# Patient Record
Sex: Male | Born: 1940 | Race: White | Hispanic: No | Marital: Married | Smoking: Former smoker
Health system: Southern US, Community
[De-identification: ages and names within clinical notes are randomized; demographics above are authoritative.]

## PROBLEM LIST (undated history)

## (undated) DIAGNOSIS — J449 Chronic obstructive pulmonary disease, unspecified: Secondary | ICD-10-CM

## (undated) DIAGNOSIS — K900 Celiac disease: Secondary | ICD-10-CM

## (undated) HISTORY — DX: Celiac disease: K90.0

## (undated) HISTORY — DX: Chronic obstructive pulmonary disease, unspecified: J44.9

---

## 1999-05-13 ENCOUNTER — Encounter: Payer: Self-pay | Admitting: *Deleted

## 1999-05-13 ENCOUNTER — Encounter: Admission: RE | Admit: 1999-05-13 | Discharge: 1999-05-13 | Payer: Self-pay | Admitting: *Deleted

## 1999-11-20 ENCOUNTER — Ambulatory Visit (HOSPITAL_COMMUNITY): Admission: RE | Admit: 1999-11-20 | Discharge: 1999-11-20 | Payer: Self-pay | Admitting: Gastroenterology

## 2003-03-16 HISTORY — PX: TOTAL HIP ARTHROPLASTY: SHX124

## 2004-04-02 ENCOUNTER — Inpatient Hospital Stay (HOSPITAL_COMMUNITY): Admission: EM | Admit: 2004-04-02 | Discharge: 2004-04-06 | Payer: Self-pay | Admitting: Emergency Medicine

## 2004-04-02 ENCOUNTER — Encounter: Admission: RE | Admit: 2004-04-02 | Discharge: 2004-04-02 | Payer: Self-pay | Admitting: Occupational Medicine

## 2005-11-16 ENCOUNTER — Ambulatory Visit: Payer: Self-pay | Admitting: Emergency Medicine

## 2005-12-29 ENCOUNTER — Ambulatory Visit: Payer: Self-pay | Admitting: Emergency Medicine

## 2006-02-09 ENCOUNTER — Ambulatory Visit: Payer: Self-pay | Admitting: Emergency Medicine

## 2007-01-10 DIAGNOSIS — F17200 Nicotine dependence, unspecified, uncomplicated: Secondary | ICD-10-CM | POA: Insufficient documentation

## 2007-01-10 DIAGNOSIS — J438 Other emphysema: Secondary | ICD-10-CM

## 2007-01-10 DIAGNOSIS — J45909 Unspecified asthma, uncomplicated: Secondary | ICD-10-CM | POA: Insufficient documentation

## 2007-01-10 DIAGNOSIS — Z96649 Presence of unspecified artificial hip joint: Secondary | ICD-10-CM | POA: Insufficient documentation

## 2007-01-10 DIAGNOSIS — J449 Chronic obstructive pulmonary disease, unspecified: Secondary | ICD-10-CM

## 2007-01-10 DIAGNOSIS — K900 Celiac disease: Secondary | ICD-10-CM

## 2010-06-18 ENCOUNTER — Inpatient Hospital Stay (HOSPITAL_COMMUNITY)
Admission: EM | Admit: 2010-06-18 | Discharge: 2010-06-22 | DRG: 191 | Disposition: A | Discharge status: Home Health | Payer: Medicare Other | Attending: Internal Medicine | Admitting: Internal Medicine

## 2010-06-18 ENCOUNTER — Emergency Department (HOSPITAL_COMMUNITY): Payer: Medicare Other

## 2010-06-18 DIAGNOSIS — M8448XA Pathological fracture, other site, initial encounter for fracture: Secondary | ICD-10-CM | POA: Diagnosis present

## 2010-06-18 DIAGNOSIS — F17200 Nicotine dependence, unspecified, uncomplicated: Secondary | ICD-10-CM | POA: Diagnosis present

## 2010-06-18 DIAGNOSIS — J441 Chronic obstructive pulmonary disease with (acute) exacerbation: Principal | ICD-10-CM | POA: Diagnosis present

## 2010-06-18 DIAGNOSIS — R000 Tachycardia, unspecified: Secondary | ICD-10-CM | POA: Diagnosis present

## 2010-06-18 DIAGNOSIS — R0609 Other forms of dyspnea: Secondary | ICD-10-CM | POA: Diagnosis present

## 2010-06-18 DIAGNOSIS — R0989 Other specified symptoms and signs involving the circulatory and respiratory systems: Secondary | ICD-10-CM | POA: Diagnosis present

## 2010-06-18 DIAGNOSIS — R64 Cachexia: Secondary | ICD-10-CM | POA: Diagnosis present

## 2010-06-18 DIAGNOSIS — E873 Alkalosis: Secondary | ICD-10-CM | POA: Diagnosis present

## 2010-06-18 DIAGNOSIS — E871 Hypo-osmolality and hyponatremia: Secondary | ICD-10-CM | POA: Diagnosis present

## 2010-06-18 DIAGNOSIS — Z23 Encounter for immunization: Secondary | ICD-10-CM

## 2010-06-18 DIAGNOSIS — G8929 Other chronic pain: Secondary | ICD-10-CM | POA: Diagnosis present

## 2010-06-18 DIAGNOSIS — I2789 Other specified pulmonary heart diseases: Secondary | ICD-10-CM | POA: Diagnosis present

## 2010-06-18 DIAGNOSIS — I10 Essential (primary) hypertension: Secondary | ICD-10-CM | POA: Diagnosis present

## 2010-06-18 DIAGNOSIS — Z9981 Dependence on supplemental oxygen: Secondary | ICD-10-CM

## 2010-06-18 LAB — DIFFERENTIAL
Basophils Absolute: 0 10*3/uL (ref 0.0–0.1)
Basophils Relative: 0 % (ref 0–1)
Eosinophils Absolute: 0 10*3/uL (ref 0.0–0.7)
Eosinophils Relative: 1 % (ref 0–5)
Lymphocytes Relative: 11 % — ABNORMAL LOW (ref 12–46)
Lymphs Abs: 0.7 10*3/uL (ref 0.7–4.0)
Monocytes Absolute: 0.6 10*3/uL (ref 0.1–1.0)
Monocytes Relative: 10 % (ref 3–12)
Neutro Abs: 4.6 10*3/uL (ref 1.7–7.7)
Neutrophils Relative %: 78 % — ABNORMAL HIGH (ref 43–77)

## 2010-06-18 LAB — BASIC METABOLIC PANEL
BUN: 16 mg/dL (ref 6–23)
CO2: 32 mEq/L (ref 19–32)
Calcium: 9.2 mg/dL (ref 8.4–10.5)
Chloride: 82 mEq/L — ABNORMAL LOW (ref 96–112)
Creatinine, Ser: 0.71 mg/dL (ref 0.4–1.5)
GFR calc Af Amer: >60 mL/min (ref >60)
GFR calc non Af Amer: >60 mL/min (ref >60)
Glucose, Bld: 107 mg/dL — ABNORMAL HIGH (ref 70–99)
Potassium: 4.4 mEq/L (ref 3.5–5.1)
Sodium: 121 mEq/L — ABNORMAL LOW (ref 135–145)

## 2010-06-18 LAB — CBC
HCT: 36.6 % — ABNORMAL LOW (ref 39.0–52.0)
Hemoglobin: 12.9 g/dL — ABNORMAL LOW (ref 13.0–17.0)
MCH: 32.2 pg (ref 26.0–34.0)
MCHC: 35.2 g/dL (ref 30.0–36.0)
MCV: 91.3 fL (ref 78.0–100.0)
Platelets: 212 10*3/uL (ref 150–400)
RBC: 4.01 MIL/uL — ABNORMAL LOW (ref 4.22–5.81)
RDW: 13.6 % (ref 11.5–15.5)
WBC: 5.9 10*3/uL (ref 4.0–10.5)

## 2010-06-19 DIAGNOSIS — R0609 Other forms of dyspnea: Secondary | ICD-10-CM

## 2010-06-19 DIAGNOSIS — M7989 Other specified soft tissue disorders: Secondary | ICD-10-CM

## 2010-06-19 DIAGNOSIS — R0989 Other specified symptoms and signs involving the circulatory and respiratory systems: Secondary | ICD-10-CM

## 2010-06-19 LAB — COMPREHENSIVE METABOLIC PANEL
AST: 67 U/L — ABNORMAL HIGH (ref 0–37)
Albumin: 3.6 g/dL (ref 3.5–5.2)
BUN: 16 mg/dL (ref 6–23)
CO2: 33 mEq/L — ABNORMAL HIGH (ref 19–32)
Calcium: 9 mg/dL (ref 8.4–10.5)
Chloride: 87 mEq/L — ABNORMAL LOW (ref 96–112)
Creatinine, Ser: 0.68 mg/dL (ref 0.4–1.5)
GFR calc Af Amer: >60 mL/min (ref >60)
GFR calc non Af Amer: >60 mL/min (ref >60)
Total Bilirubin: 0.7 mg/dL (ref 0.3–1.2)

## 2010-06-19 LAB — CK TOTAL AND CKMB (NOT AT ARMC)
CK, MB: 20.8 ng/mL (ref 0.3–4.0)
Relative Index: 6 — ABNORMAL HIGH (ref 0.0–2.5)

## 2010-06-19 LAB — BASIC METABOLIC PANEL
CO2: 29 mEq/L (ref 19–32)
Chloride: 86 mEq/L — ABNORMAL LOW (ref 96–112)
GFR calc Af Amer: >60 mL/min (ref >60)
Glucose, Bld: 98 mg/dL (ref 70–99)
Sodium: 124 mEq/L — ABNORMAL LOW (ref 135–145)

## 2010-06-19 LAB — TROPONIN I: Troponin I: 0.01 ng/mL (ref 0.00–0.06)

## 2010-06-19 LAB — CARDIAC PANEL(CRET KIN+CKTOT+MB+TROPI)
CK, MB: 23.7 ng/mL (ref 0.3–4.0)
CK, MB: 25.6 ng/mL (ref 0.3–4.0)
Relative Index: 6.2 — ABNORMAL HIGH (ref 0.0–2.5)
Relative Index: 6.8 — ABNORMAL HIGH (ref 0.0–2.5)
Total CK: 385 U/L — ABNORMAL HIGH (ref 7–232)
Total CK: 377 U/L — ABNORMAL HIGH (ref 7–232)

## 2010-06-19 LAB — BRAIN NATRIURETIC PEPTIDE: Pro B Natriuretic peptide (BNP): 50 pg/mL (ref 0.0–100.0)

## 2010-06-19 LAB — TSH: TSH: 0.395 u[IU]/mL (ref 0.350–4.500)

## 2010-06-19 LAB — CREATININE, URINE, RANDOM: Creatinine, Urine: 94.3 mg/dL

## 2010-06-19 LAB — PROTEIN, URINE, RANDOM: Total Protein, Urine: 34 mg/dL

## 2010-06-20 DIAGNOSIS — J441 Chronic obstructive pulmonary disease with (acute) exacerbation: Secondary | ICD-10-CM

## 2010-06-20 LAB — CBC
MCH: 32.3 pg (ref 26.0–34.0)
MCHC: 34.8 g/dL (ref 30.0–36.0)
Platelets: 220 10*3/uL (ref 150–400)
RDW: 13.7 % (ref 11.5–15.5)

## 2010-06-20 LAB — BLOOD GAS, ARTERIAL
Acid-Base Excess: 8.7 mmol/L — ABNORMAL HIGH (ref 0.0–2.0)
Bicarbonate: 34.3 mEq/L — ABNORMAL HIGH (ref 20.0–24.0)
Patient temperature: 98.6
TCO2: 36.2 mmol/L (ref 0–100)
pH, Arterial: 7.352 (ref 7.350–7.450)

## 2010-06-20 LAB — RENAL FUNCTION PANEL
Albumin: 3.7 g/dL (ref 3.5–5.2)
BUN: 16 mg/dL (ref 6–23)
Calcium: 9.2 mg/dL (ref 8.4–10.5)
Chloride: 86 mEq/L — ABNORMAL LOW (ref 96–112)
Creatinine, Ser: 0.64 mg/dL (ref 0.4–1.5)

## 2010-06-20 LAB — MAGNESIUM: Magnesium: 1.9 mg/dL (ref 1.5–2.5)

## 2010-06-21 LAB — CBC
HCT: 35.1 % — ABNORMAL LOW (ref 39.0–52.0)
Hemoglobin: 11.9 g/dL — ABNORMAL LOW (ref 13.0–17.0)
MCHC: 33.9 g/dL (ref 30.0–36.0)
WBC: 5.7 10*3/uL (ref 4.0–10.5)

## 2010-06-21 LAB — BASIC METABOLIC PANEL
CO2: 39 mEq/L — ABNORMAL HIGH (ref 19–32)
Glucose, Bld: 99 mg/dL (ref 70–99)
Potassium: 3.9 mEq/L (ref 3.5–5.1)
Sodium: 127 mEq/L — ABNORMAL LOW (ref 135–145)

## 2010-06-22 DIAGNOSIS — J441 Chronic obstructive pulmonary disease with (acute) exacerbation: Secondary | ICD-10-CM

## 2010-06-22 LAB — RENAL FUNCTION PANEL
BUN: 20 mg/dL (ref 6–23)
Chloride: 84 mEq/L — ABNORMAL LOW (ref 96–112)
Glucose, Bld: 165 mg/dL — ABNORMAL HIGH (ref 70–99)
Potassium: 3.7 mEq/L (ref 3.5–5.1)

## 2010-06-22 LAB — OSMOLALITY: Osmolality: 274 mOsm/kg — ABNORMAL LOW (ref 275–300)

## 2010-06-22 LAB — KAPPA/LAMBDA LIGHT CHAINS
Kappa free light chain: 0.88 mg/dL (ref 0.33–1.94)
Kappa, lambda light chain ratio: 1.05 (ref 0.26–1.65)

## 2010-06-22 NOTE — Consult Note (Signed)
NAME:  Timothy Gonzalez, Timothy Gonzalez NO.:  [id number]  MEDICAL RECORD NO.:  [id number]           PATIENT TYPE:  I  LOCATION:  6711                         FACILITY:  [facility]  PHYSICIAN:  Zetta Bills, MD          DATE OF BIRTH:  1940-05-06  DATE OF CONSULTATION:  06/19/2010 DATE OF DISCHARGE:                                CONSULTATION   Nephrology was consulted by Dr. Betti Cruz of the [facility] for the evaluation and management of Timothy Gonzalez' hyponatremia.  REFERRING PHYSICIAN:  Dr. Betti Cruz of the [facility].  HISTORY OF PRESENT ILLNESS:  This is a 70 year old Caucasian man with a past medical history significant for several years of tobacco use with consequent COPD who quit about 2 weeks ago.  He is also on chronic oxygen therapy on account of his chronic obstructive lung disease.  He was noted to have lower extremity edema sometime last week and started on furosemide therapy and a subsequent lab check revealed a sodium of 120 that prompted his primary care provider, Dr. Lenise Arena, to send him to the hospital for further evaluation and management.  Timothy Gonzalez reports that he has had pedal edema now for about 6 weeks or so when he started sleeping in a recliner with his feet lower than the rest of his body in order to facilitate better rest.  He denies any recurrent problems with nausea, vomiting, or diarrhea.  States that he had frequent diarrhea a while ago prior to being diagnosed with celiac sprue and being placed on a gluten-free diet.  Denies any cough, however, has occasional shortness of breath and occasional a.m. sputum production.  Review of available records as far as 2007, indicates he likely has chronic hyponatremia with his best sodium at around 135 during hospitalization in 2007.  Outpatient records from the interim period are unavailable at the time of his consultation.  Since his admission to the hospital, he has undergone some  evaluation that reveals a normal TSH, elevated urine osmolality at 514, appropriately depressed serum osmolality at 257, normal 2-D echocardiogram with normal BNP, elevated urine sodium at 69 mEq/L (on furosemide) and sodium levels that have ranged from 120-127.  On admission, his furosemide therapy was stopped and he was temporarily started on normal saline of which he got about a liter that improved his sodium somewhat from 121-127; however, discontinuation of the same led to the sodium dropping to 124.  ACE inhibitor therapy was continued.  Chest x-ray done yesterday was negative for any space-occupying lesions.  He remains hypoxic with an oxygen saturation of 92-95% on oxygen by nasal cannula.  Worsening shortness of breath this morning is what prompted the discontinuation of his normal saline.  He has been getting around-the-clock nebulized bronchodilators and he is also being managed with corticosteroids as part of COPD exacerbation.  He denies significant NSAID use, admits to drinking "a large amount of coffee per day," approximately 8 ounces every 45 minutes from sunrise until sunset.  PAST MEDICAL HISTORY: 1. Chronic obstructive lung disease, oxygen dependent. 2. Celiac disease. 3. History of chronic back pain secondary to fracture.  4. Status post right hip fracture. 5. What appears to be chronic hyponatremia.  MEDICATIONS: 1. Spiriva HandiHaler 18 mcg 1 capsule daily. 2. Benazepril 20 mg p.o. daily. 3. Vicodin 5/500 one p.o. q.6 h. p.r.n. 4. ProAir inhaler 2 puffs b.i.d. p.r.n. 5. Over-the-counter Primatene Mist.  ALLERGIES:  No known drug allergies.  SOCIAL HISTORY:  Married and resides at home with his wife.  He denies any alcohol use, quit smoking about 2 weeks ago.  States that appetite is improved since then.  Denies any illicit drug abuse.  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  FAMILY HISTORY:  Noncontributory.  REVIEW  OF SYSTEMS:  Attempted to obtain; however, the patient had minimal verbal responses while he was on the nebulizer treatment.  PHYSICAL EXAMINATION:  VITAL SIGNS:  Blood pressure 144/76, respiratory rate ranging from 18-24, heart rate ranging 86-97, temperature of 98.2 degrees Fahrenheit. GENERAL:  Thin, tall Caucasian man who appears malnourished. HEAD, NECK, AND ENT:  Head is normocephalic and atraumatic.  Pupils are bilaterally equal and reactive to light.  Extraocular muscle movements are normal. NECK:  Supple with some difficulties with mobilization.  JVD is present bilaterally to about 7 cm. CARDIOVASCULAR:  Pulses regular in rate and rhythm.  Heart sounds S1 and S2 are normal without any murmurs, rubs, or gallops. RESPIRATORY:  Distant breath sounds bilaterally without any rales, retractions, or rhonchi. ABDOMEN:  Soft, flat, nontender without any organomegaly or masses. Bowel sounds are normal. EXTREMITIES:  2+ to 3+ edema is palpable bipedally extending all the way to the knees.  LABORATORY DATA:  Sodium 124, potassium 4.5, bicarbonate 29, BUN 17, creatinine 0.6, glucose 90, phosphorus 3.6, magnesium 1.7, albumin 3.6, total protein 6.1, AST 67, ALT 50, hemoglobin 12.9 g/dL, platelet count 308, white cell count 5900.  ASSESSMENT AND PLAN: 1. Hyponatremia.  Labs reflect that he likely has a chronic     hyponatremia, most likely tied into his chronic obstructive lung     disease.  His low plasma osmolality reflects that this is a true     hyponatremia, while his volume status indicates that he is volume     overloaded/hypervolemic.  His urine osmolality is     inappropriately elevated with an elevated urine sodium (albeit on     furosemide) suggesting an activated ADH state.  His thyroid-     stimulating hormone is within normal limits and cardiac assessment     reflects a preserved systolic function.  This constellation raises     concern for syndrome of inappropriate ADH  secretion especially in a     patient with a history of chronic tobacco use and COPD for     malignancy.  A COPD flare, however, can also provoke ADH activation     with ACE inhibitor therapy for further limiting ADH suppression.  I     agree with holding his ACE inhibitor therapy, restricting all oral     fluids to 1238ml/day and avoiding isotonic saline given his high urine sodium and     volume overload.  I would restart his furosemide and monitor labs     sequentially.  Do not feel based on his neurological status that we     need an aquaretic or hypertonic saline at this time.  He will     eventually need a CT scan of his chest, consideration of CT abdomen     and pelvis in order to rule out occult malignancy.  Given his mild     hypoalbuminemia, I will check a urine protein to creatinine ratio     as well as an SPEP and free light     chains to screen for plasma cell dyscrasia. 2. Hypertension.  Discontinue ACE inhibitor for now, restart     furosemide and avoid all thiazide diuretics.  Start him on a low     dose of calcium channel blocker to engage with blood pressure     control.     Zetta Bills, MD     JP/MEDQ  D:  06/19/2010  T:  06/20/2010  Job:  [id number]  cc:   Joycelyn Rua, M.D.  Electronically Signed by Zetta Bills MD on 06/22/2010 02:34:17 PM

## 2010-06-23 LAB — PROTEIN ELECTROPHORESIS, SERUM
Gamma Globulin: 14 % (ref 11.1–18.8)
M-Spike, %: NOT DETECTED g/dL
Total Protein ELP: 6.4 g/dL (ref 6.0–8.3)

## 2010-06-25 NOTE — Discharge Summary (Signed)
[name], [name] NO.:  [id number]  MEDICAL RECORD NO.:  [id number]           PATIENT TYPE:  LOCATION:                                 FACILITY:  PHYSICIAN:  Andreas Blower, MD       DATE OF BIRTH:  1941-01-12  DATE OF ADMISSION: DATE OF DISCHARGE:                              DISCHARGE SUMMARY   PRIMARY CARE PHYSICIAN:  Joycelyn Rua, MD  NEPHROLOGIST:  Dr. Allena Katz  PULMONOLOGIST:  Leslye Peer, MD  DISCHARGE DIAGNOSES: 1. Hyponatremia. 2. Chronic obstructive pulmonary disease exacerbation. 3. Hypertension. 4. Acute respiratory distress due to chronic obstructive pulmonary     disease exacerbation. 5. Tachycardia. 6. Lower extremity swelling. 7. 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 Elevated bicarb/metabolic alkalosis.  DISCHARGE MEDICATIONS: 1. Amlodipine 5 mg p.o. daily. 2. Symbicort 2 puffs inhaled twice daily. 3. Prednisone 10 mg p.o. daily for 3 days, then 5 mg p.o. daily for 3     days, then discontinue. 4. Albuterol nebulizer inhaled 4 times a day as needed. 5. Hydrocodone/APAP 5/500 every 6 hours as needed for pain. 6. Lasix 20 mg p.o. daily. 7. Albuterol 90 mcg 1 puff every 6 hours as needed for shortness of     breath. 8. Spiriva 18 mcg inhaled daily. 9. Vitamin B12 1000 mcg p.o. daily.  BRIEF ADMITTING HISTORY AND PHYSICAL:  [name] is a 70 year old gentleman with history of tobacco use who quit recently about 1-2 weeks ago and COPD requiring oxygen with chronic lower extremity swelling.  He was brought in to the ER after he was called by his primary care physician that he had a low sodium of 120.  RADIOLOGY/IMAGING:  The patient had portable chest x-ray which showed no interstitial changes, COPD, no pneumonia noted.  The patient had a 2-D echocardiogram on June 19, 2010, which showed left ventricle cavity size was normal.  Wall thickness was normal.  Systolic function was normal. Ejection fraction  was 55-60%.  Pulmonary artery systolic pressure was mildly increased to 42 mmHg.  LABORATORY DATA:  CBC shows a white count of 5.7, hemoglobin 11.9, hematocrit 35.1, and platelet count 209.  Electrolytes:  Sodium 131, initially on presentation was 121, potassium 3.7, chloride 87, CO2 of 24, BUN 20, creatinine 0.70.  Liver function tests normal except AST is 67.  Kappa lambda light chain and the ratio was normal.  Troponins negative x3.  TSH is 0.395.  CONSULTATIONS OBTAINED DURING COURSE OF HOSPITAL STAY: 1. Dr. Allena Katz with Renal. 2. Dr. Delton Coombes with Pulmonary.  DISPOSITION AND FOLLOWUP:  The patient is to follow with Dr. Joycelyn Rua, his primary care physician in 1 week.  Dr. Daphane Shepherd is to check laboratory workup for BMET in 2-3 days.  The patient is to follow with Dr. Delton Coombes, his new pulmonologist on Jul 23, 2010, at 12 p.m.  HOSPITAL COURSE BY PROBLEM: 1. Acute respiratory distress/COPD exacerbation.  Initially, the     patient was started on a prednisone taper.  During the course of     the hospital stay, the patient had a few episodes of shortness of     breath  especially in the morning.  As a result, pulmonary     consultation was obtained.  Dr. Delton Coombes evaluated the patient and     adjusted his COPD inhalers and medications.  During the course of     the hospital stay, the patient's breathing continued to improve.     After discharge, the patient is to follow with Dr. Delton Coombes on Jul 23, 2010.  Could consider getting outpatient pulmonary function tests     after acute issues are resolved. 2. Hyponatremia, most likely due to COPD exacerbation and ACE     inhibitor use.  During the course of the hospital stay, his     benazepril was discontinued.  The patient was also fluid restricted     and was started on Lasix.  During the course of the hospital stay,     the patient's sodium continued to improve to 131 at the time of     discharge.  The patient is to follow up with his primary  care     physician for a BMET to be checked in 2-3 days.  Follow up with     Renal as needed outpatient. 3. Hypertension.  Given that the patient's benazepril was     discontinued, the patient was started on amlodipine with good     control of his blood pressure. 4. Tachycardia, most likely secondary to albuterol, stable during the     course of the hospital stay. 5. Lower extremity swelling, likely secondary to shortness of breath,     COPD, and mild pulmonary hypertension, stable during the course of     the hospital stay. 6. Elevated bicarbonate/metabolic alkalosis.  During the course of the     hospital stay, the patient was fluid restricted and was on Lasix     twice daily.  I suspect that the elevated bicarbonate level may be     due to contracture alkalosis.  His Lasix dose was decreased to 20     mg p.o. daily at the time of discharge.  His elevated bicarbonate     may also be due to his COPD. 7. Mild pulmonary artery hypertension noted on 2-D echo, likely     secondary to COPD, stable at this time.  Time spent on discharge talking to the patient and coordinating care was 35 minutes.  At the time of discharge, we also arranged for hospital bed, bathroom chair, and wheelchair.   Andreas Blower, MD   SR/MEDQ  D:  06/22/2010  T:  06/23/2010  Job:  [id number]  cc:   Joycelyn Rua, M.D.  Electronically Signed by Wardell Heath Plato Alspaugh  on 06/25/2010 11:26:54 AM

## 2010-07-22 ENCOUNTER — Encounter: Payer: Self-pay | Admitting: Emergency Medicine

## 2010-07-23 ENCOUNTER — Encounter: Payer: Self-pay | Admitting: Emergency Medicine

## 2010-07-23 ENCOUNTER — Ambulatory Visit (INDEPENDENT_AMBULATORY_CARE_PROVIDER_SITE_OTHER): Payer: Medicare Other | Admitting: Emergency Medicine

## 2010-07-23 VITALS — BP 146/80 | HR 105 | Temp 96.8°F | Ht 75.0 in | Wt 139.8 lb

## 2010-07-23 DIAGNOSIS — J449 Chronic obstructive pulmonary disease, unspecified: Secondary | ICD-10-CM

## 2010-07-23 MED ORDER — BUDESONIDE-FORMOTEROL FUMARATE 160-4.5 MCG/ACT IN AERO: 2.0000 | INHALATION_SPRAY | Freq: Two times a day (BID) | RESPIRATORY_TRACT | Status: DC

## 2010-07-23 MED ORDER — TIOTROPIUM BROMIDE MONOHYDRATE 18 MCG IN CAPS: 18.0000 ug | ORAL_CAPSULE | Freq: Every day | RESPIRATORY_TRACT | Status: DC

## 2010-07-23 NOTE — Progress Notes (Signed)
  Subjective:    Patient ID: Timothy Gonzalez, male    DOB: 06/23/1940, 70 y.o.   MRN: [id number]  HPI  70 yo man, severe COPD with tobacco abuse, seen by me last in 2007. Was admitted with exacerbation in April 2012, seen by me then. Discharged on Spiriva + Symbicort, Prn albuterol. Stopped smoking after discharge! Since he was discharged has done well. He has hx panic attacks, well managed and helping the breathing as well.    Review of Systems  Respiratory:       Shortness of breath w/ activity  Musculoskeletal:       Hand / feet swelling  Psychiatric/Behavioral:       Anxiety   Past Medical History  Diagnosis Date  . COPD (chronic obstructive pulmonary disease)   . Celiac disease   . Asthma     childhood     No family history on file.   History   Social History  . Marital Status: Married    Spouse Name: N/A    Number of Children: 4  . Years of Education: N/A   Occupational History  . 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 Smoking status: Former Smoker -- 0.2 packs/day for 48 years    Types: Cigarettes    Quit date: 06/14/2010  . Smokeless tobacco: Not on file  . Alcohol Use: Yes     rare  . Drug Use: No  . Sexually Active: Not on file   Other Topics Concern  . Not on file   Social History Narrative  . No narrative on file     No Known Allergies   Outpatient Prescriptions Prior to Visit  Medication Sig Dispense Refill  . albuterol (PROAIR HFA) 108 (90 BASE) MCG/ACT inhaler Inhale 2 puffs into the lungs every 6 (six) hours as needed.        [name] albuterol (PROVENTIL) (2.5 MG/3ML) 0.083% nebulizer solution Take 2.5 mg by nebulization every 4 (four) hours as needed.        [name] amLODipine (NORVASC) 5 MG tablet Take 5 mg by mouth daily.        [name] HYDROcodone-acetaminophen (VICODIN) 5-500 MG per tablet Take 1 tablet by mouth every 6 (six) hours as needed.        . vitamin B-12 (CYANOCOBALAMIN) 1000 MCG tablet Take 1,000 mcg by mouth daily.         [name] tiotropium (SPIRIVA) 18 MCG inhalation capsule Place 18 mcg into inhaler and inhale daily.        . furosemide (LASIX) 20 MG tablet Take 20 mg by mouth daily.        . budesonide-formoterol (SYMBICORT) 80-4.5 MCG/ACT inhaler Inhale 2 puffs into the lungs 2 (two) times daily.               Objective:   Physical Exam Gen: Pleasant, cachectic, in no distress,  normal affect on RA  ENT: No lesions,  mouth clear,  oropharynx clear, no postnasal drip  Neck: No JVD, no TMG, no carotid bruits  Lungs: Very distant,   Cardiovascular: RRR, heart sounds normal, no murmur or gallops, B LE edema, 2+  Musculoskeletal: No deformities, no cyanosis or clubbing  Neuro: alert, non focal  Skin: Warm, no lesions or rashes       Assessment & Plan:  COPD Continue Symbicort + Spiriva O2 with exertion Congratulated and supported smoking cessation ROV 3 months

## 2010-07-23 NOTE — Assessment & Plan Note (Signed)
Continue Symbicort + Spiriva O2 with exertion Congratulated and supported smoking cessation ROV 3 months

## 2010-07-23 NOTE — Patient Instructions (Signed)
Continue your current inhaled medications, Spiriva and Symbicort Use albuterol as needed Wear your oxygen with exertion Follow up with Dr Delton Coombes in 3 months or sooner if you have any problems.

## 2010-07-26 NOTE — H&P (Signed)
NAME:  Timothy Gonzalez, Timothy Gonzalez           ACCOUNT NO.:  [id number]  MEDICAL RECORD NO.:  [id number]           PATIENT TYPE:  E  LOCATION:  [facility]                         FACILITY:  [facility]  PHYSICIAN:  Montie Gelardi I Sharnetta Gielow, MD      DATE OF BIRTH:  1940-06-23  DATE OF ADMISSION:  06/18/2010 DATE OF DISCHARGE:                             HISTORY & PHYSICAL   PRIMARY CARE PHYSICIAN:  Joycelyn Rua, MD  CHIEF COMPLAINT:  The patient sent from an MD office for evaluation of low sodium.  HISTORY OF PRESENT ILLNESS:  This is a 70 year old gentleman with a history of ongoing tobacco abuse just quit smoking 2 weeks ago, history of COPD, worsening, requiring home oxygen which was delivered to the patient 2 days ago.  Chronic lower extremity swelling, being recently treated by Lasix.  As per the patient, Lasis was recently started and the patient received dose yesterday and today.  The patient has a checkup with his MD, blood drawn, and he received a call from the MD suggesting a low sodium of 120 and was asked to come to the hospital for further evaluation.  The patient denies any shortness of breath that is not at the baseline.  He lives on a recliner.  His left lower extremity, keeps them elevated.  He denies any chest pain or shortness of breath.  Denies any cough.  Denies any nausea or vomiting or abdominal pain.  He has this chronic lower extremity swelling for more than 6 weeks now.  He denies any numbness or weakness.  He has chronic back pain from fracture.  PAST MEDICAL HISTORY: 1. COPD, on 2 L home oxygen. 2. Celiac sprue. 3. Chronic fracture of his back, on pain medication. 4. Ongoing tobacco abuse. 5. Status post right hip fracture.  ALLERGIES:  No known drug allergies.  SOCIAL HISTORY:  The patient is married, lives with his wife.  Denies any drinking.  Denies any illicit drug abuse.  He recently quit smoking, he used to smoke for more than 47 years.  PAST SURGICAL HISTORY:  As  above.  FAMILY HISTORY:  Noncontributory.  REVIEW OF SYSTEMS:  As per HPI.  All systems were checked and reviewed with the patient.  PHYSICAL EXAMINATION:  VITAL SIGNS:  Temperature 98, blood pressure 143/76, pulse rate 109, respiratory rate 24, and saturating 93% on 2 L. GENERAL:  The patient is cachectic.  No jaundice and not pale. HEENT:  Pupils are equally reactive to light and accommodation.  Mucosa moist.  No oral thrush. NECK:  Supple.  No lymphadenopathy. HEART:  S1-S2.  No added sounds. LUNGS:  Normal vesicular breathing.  I did not hear any wheezing, crackles, or rhonchi. ABDOMEN:  Soft, nontender.  Bowel sounds positive. EXTREMITIES:  Lower extremities:  There is significant lower limb edema, 4 and above. CNS:  The patient awake, alert, and oriented x3 with no focal neurological findings.  LABORATORY DATA:  Blood workup done in the emergency room.  White blood cells 5.9, hemoglobin 12.9, hematocrit 36.6, and platelets 212.  Sodium 121, potassium 4.4, chloride 82, CO2 32, glucose 107, BUN 16, creatinine 0.71, calcium 9.2.  Chest  x-ray:  COPD with no pneumonia or edema.  ASSESSMENT AND PLAN: 1. Hyponatremia.  Cause was unclear.  The patient has significant     lower extremity swelling.  It could be related to medications,     mainly lisinopril or Lasix.  Also, the patient could have cor     pulmonale with his significant lower extremity swelling,versus     hypoalbuminemia.  The patient will be admitted to tele.  We will     get serum osmolality, urine osmolality, serum urine, urine     creatinine, and plasma osmolality.  We will treat currently the     patient with normal saline 50 mL per hour and checking his BNP.also will restrict free water intake 800 cc per day     Also, we will check 2-D echo.       assess.  could be also related to chf and core pulmonale ,consider iv lasix if above failed 2. COPD, on home oxygen seems stable.  We will continue with      nebulizer. 3. Significant lower extremity swelling.  We will check CMET to assess     patient and also we will check 2-D checking for pulmonary     hypertension and cor pulmonale.  Further recommendation as hospital     course progress.     Anaeli Cornwall Bosie Helper, MD     HIE/MEDQ  D:  06/18/2010  T:  06/19/2010  Job:  [id number]  Electronically Signed by Ebony Cargo MD on 07/26/2010 02:42:13 PM

## 2010-08-06 ENCOUNTER — Encounter: Payer: Self-pay | Admitting: Emergency Medicine

## 2010-10-23 ENCOUNTER — Ambulatory Visit (INDEPENDENT_AMBULATORY_CARE_PROVIDER_SITE_OTHER): Payer: Medicare Other | Admitting: Emergency Medicine

## 2010-10-23 ENCOUNTER — Encounter: Payer: Self-pay | Admitting: Emergency Medicine

## 2010-10-23 VITALS — BP 120/76 | HR 100 | Temp 97.8°F | Ht 75.0 in | Wt 141.2 lb

## 2010-10-23 DIAGNOSIS — J438 Other emphysema: Secondary | ICD-10-CM

## 2010-10-23 NOTE — Progress Notes (Signed)
  Subjective:    Patient ID: Timothy Gonzalez, male    DOB: 09-Mar-1941, 70 y.o.   MRN: [id number]  HPI  70 yo man, severe COPD with tobacco abuse, seen by me last in 2007. Was admitted with exacerbation in April 2012, seen by me then. Discharged on Spiriva + Symbicort, Prn albuterol. Stopped smoking after discharge! Since he was discharged has done well. He has hx panic attacks, well managed and helping the breathing as well.   ROV 10/23/10 -- follows up for severe COPD. He reports that he is breathing better than last visit. Wearing o2 reliably with exertion. Has not restarted smoking. He is taking Spiriva and Symbicort. No exacerbations since last visit.    Review of Systems  Respiratory:       Shortness of breath w/ activity  Musculoskeletal:       Hand / feet swelling  Psychiatric/Behavioral:       Anxiety      Objective:   Physical Exam Gen: Pleasant, cachectic, in no distress,  normal affect on RA  ENT: No lesions,  mouth clear,  oropharynx clear, no postnasal drip  Neck: No JVD, no TMG, no carotid bruits  Lungs: Very distant,   Cardiovascular: RRR, heart sounds normal, no murmur or gallops, B LE edema, 2+  Musculoskeletal: No deformities, no cyanosis or clubbing  Neuro: alert, non focal  Skin: Warm, no lesions or rashes   Assessment & Plan:  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

## 2010-10-23 NOTE — Patient Instructions (Addendum)
Please continue your Spiriva and Symbicort  Use albuterol as needed Wear your oxygen with all exertion Get the flu shot in the Fall Follow up with Dr Delton Coombes in 4 months or sooner if you have any problems.

## 2010-10-23 NOTE — Assessment & Plan Note (Signed)
Stable on current regimen - continue the Spiriva and Symbicort - O2 with exertion - follow up in 4 months - flu shot in the fall

## 2011-03-01 ENCOUNTER — Ambulatory Visit (INDEPENDENT_AMBULATORY_CARE_PROVIDER_SITE_OTHER): Payer: Medicare Other | Admitting: Emergency Medicine

## 2011-03-01 ENCOUNTER — Encounter: Payer: Self-pay | Admitting: Emergency Medicine

## 2011-03-01 VITALS — BP 132/78 | HR 102 | Temp 98.1°F | Ht 75.0 in | Wt 149.0 lb

## 2011-03-01 DIAGNOSIS — J449 Chronic obstructive pulmonary disease, unspecified: Secondary | ICD-10-CM

## 2011-03-01 DIAGNOSIS — J4489 Other specified chronic obstructive pulmonary disease: Secondary | ICD-10-CM

## 2011-03-01 NOTE — Assessment & Plan Note (Signed)
Severe but stable.  - continue same regimen - O2 w exertion - check ONO - rov in 4 months

## 2011-03-01 NOTE — Patient Instructions (Signed)
Please continue your inhaled medications the way you are taking them  Wear your oxygen with exertion We will check your overnight oximetry through [facility] Follow with Dr Delton Coombes in 4 months or sooner if you have any problems.

## 2011-03-01 NOTE — Progress Notes (Signed)
  Subjective:    Patient ID: Timothy Gonzalez, male    DOB: [birth year], 70 y.o.   MRN: [id number]  HPI  70 yo man, severe COPD with tobacco abuse, seen by me last in 2007. Was admitted with exacerbation in April 2012, seen by me then. Discharged on Spiriva + Symbicort, Prn albuterol. Stopped smoking after discharge! Since he was discharged has done well. He has hx panic attacks, well managed and helping the breathing as well.   ROV 10/23/10 -- follows up for severe COPD. He reports that he is breathing better than last visit. Wearing o2 reliably with exertion. Has not restarted smoking. He is taking Spiriva and Symbicort. No exacerbations since last visit.   ROV 03/01/11 -- Severe COPD (GOLD 3-4). He has been doing well since last visit, still has exertional SOB. No exacerbations since last time. No exacerbations since last time. No cigarettes. Has been reliable with O2 on exertion, wonders if he needs it at night.       Objective:   Physical Exam Gen: Pleasant, cachectic, in no distress,  normal affect on RA  ENT: No lesions,  mouth clear,  oropharynx clear, no postnasal drip  Neck: No JVD, no TMG, no carotid bruits  Lungs: Very distant,   Cardiovascular: RRR, heart sounds normal, no murmur or gallops, B LE edema, 2+  Musculoskeletal: No deformities, no cyanosis or clubbing  Neuro: alert, non focal  Skin: Warm, no lesions or rashes   Assessment & Plan:  COPD Severe but stable.  - continue same regimen - O2 w exertion - check ONO - rov in 4 months

## 2011-04-14 ENCOUNTER — Other Ambulatory Visit: Payer: Self-pay | Admitting: Emergency Medicine

## 2011-05-07 ENCOUNTER — Encounter: Payer: Self-pay | Admitting: Emergency Medicine

## 2011-07-15 ENCOUNTER — Ambulatory Visit (INDEPENDENT_AMBULATORY_CARE_PROVIDER_SITE_OTHER): Payer: Medicare Other | Admitting: Emergency Medicine

## 2011-07-15 ENCOUNTER — Encounter: Payer: Self-pay | Admitting: Emergency Medicine

## 2011-07-15 VITALS — BP 138/84 | HR 101 | Temp 98.0°F | Ht 75.0 in | Wt 144.0 lb

## 2011-07-15 DIAGNOSIS — J449 Chronic obstructive pulmonary disease, unspecified: Secondary | ICD-10-CM

## 2011-07-15 NOTE — Progress Notes (Signed)
  Subjective:    Patient ID: Timothy Gonzalez, male    DOB: Jun 14, 1940, 71 y.o.   MRN: [id number] HPI 71 yo man, severe COPD with tobacco abuse, seen by me last in 2007. Was admitted with exacerbation in April 2012, seen by me then. Discharged on Spiriva + Symbicort, Prn albuterol. Stopped smoking after discharge! Since he was discharged has done well. He has hx panic attacks, well managed and helping the breathing as well.   ROV 10/23/10 -- follows up for severe COPD. He reports that he is breathing better than last visit. Wearing o2 reliably with exertion. Has not restarted smoking. He is taking Spiriva and Symbicort. No exacerbations since last visit.   ROV 03/01/11 -- Severe COPD (GOLD 3-4). He has been doing well since last visit, still has exertional SOB. No exacerbations since last time. No exacerbations since last time. No cigarettes. Has been reliable with O2 on exertion, wonders if he needs it at night.   ROV 07/15/11 -- Severe COPD (GOLD 3-4). Has done well since last time. ONO showed that he does not need O2 with sleep, he is wearing 2L/min with exertion. He is on Spiriva + Symbicort. He feels that his breathing may be better since last time, now off chronic opioids.  No exacerbations since last time.       Objective:   Physical Exam Gen: Pleasant, cachectic, in no distress,  normal affect on RA  ENT: No lesions,  mouth clear,  oropharynx clear, no postnasal drip  Neck: No JVD, no TMG, no carotid bruits  Lungs: Very distant,   Cardiovascular: RRR, heart sounds normal, no murmur or gallops, B LE edema, 2+  Musculoskeletal: No deformities, no cyanosis or clubbing  Neuro: alert, non focal  Skin: Warm, no lesions or rashes   Assessment & Plan:  COPD - continue the spiriva + symbicort - O2 w exertion - continue smoking cessation.  - rov 6 mon

## 2011-07-15 NOTE — Assessment & Plan Note (Signed)
-   continue the spiriva + symbicort - O2 w exertion - continue smoking cessation.  - rov 6 mon

## 2011-07-15 NOTE — Patient Instructions (Signed)
Please continue your inhaled medications as ordered.  Wear your oxygen with exertion.  Follow with Dr Delton Coombes in 6 months or sooner if you have any problems

## 2011-08-20 ENCOUNTER — Other Ambulatory Visit: Payer: Self-pay | Admitting: Emergency Medicine

## 2011-08-23 ENCOUNTER — Other Ambulatory Visit: Payer: Self-pay | Admitting: Emergency Medicine

## 2011-08-23 ENCOUNTER — Telehealth: Payer: Self-pay | Admitting: Emergency Medicine

## 2011-08-23 MED ORDER — ALBUTEROL SULFATE HFA 108 (90 BASE) MCG/ACT IN AERS: 2.0000 | INHALATION_SPRAY | Freq: Four times a day (QID) | RESPIRATORY_TRACT | Status: DC | PRN

## 2011-08-23 MED ORDER — TIOTROPIUM BROMIDE MONOHYDRATE 18 MCG IN CAPS: 18.0000 ug | ORAL_CAPSULE | Freq: Every day | RESPIRATORY_TRACT | Status: DC

## 2011-08-23 MED ORDER — BUDESONIDE-FORMOTEROL FUMARATE 160-4.5 MCG/ACT IN AERO: 2.0000 | INHALATION_SPRAY | Freq: Two times a day (BID) | RESPIRATORY_TRACT | Status: AC

## 2011-08-23 NOTE — Telephone Encounter (Signed)
RX has been sent and nothing further was needed 

## 2011-10-21 ENCOUNTER — Other Ambulatory Visit: Payer: Self-pay | Admitting: Emergency Medicine

## 2012-01-11 ENCOUNTER — Encounter: Payer: Self-pay | Admitting: Emergency Medicine

## 2012-01-11 ENCOUNTER — Ambulatory Visit (INDEPENDENT_AMBULATORY_CARE_PROVIDER_SITE_OTHER): Payer: Medicare Other | Admitting: Emergency Medicine

## 2012-01-11 VITALS — BP 128/62 | HR 119 | Temp 98.0°F | Ht 74.0 in | Wt 139.8 lb

## 2012-01-11 DIAGNOSIS — Z23 Encounter for immunization: Secondary | ICD-10-CM

## 2012-01-11 DIAGNOSIS — J449 Chronic obstructive pulmonary disease, unspecified: Secondary | ICD-10-CM

## 2012-01-11 DIAGNOSIS — J4489 Other specified chronic obstructive pulmonary disease: Secondary | ICD-10-CM

## 2012-01-11 NOTE — Assessment & Plan Note (Signed)
Continue your Spiriva and Symbicort  Wear your oxygen with exertion Flu shot today Follow with Dr Delton Coombes in 6 months or sooner if you have any problems

## 2012-01-11 NOTE — Patient Instructions (Addendum)
Continue your Spiriva and Symbicort  Wear your oxygen with exertion Flu shot today Follow with Dr Leshon Armistead in 6 months or sooner if you have any problems  

## 2012-01-11 NOTE — Progress Notes (Signed)
  Subjective:    Patient ID: Timothy Gonzalez, male    DOB: January 21, 1941, 71 y.o.   MRN: [id number] HPI 71 yo man, severe COPD with tobacco abuse, seen by me last in 2007. Was admitted with exacerbation in April 2012, seen by me then. Discharged on Spiriva + Symbicort, Prn albuterol. Stopped smoking after discharge! Since he was discharged has done well. He has hx panic attacks, well managed and helping the breathing as well.   ROV 10/23/10 -- follows up for severe COPD. He reports that he is breathing better than last visit. Wearing o2 reliably with exertion. Has not restarted smoking. He is taking Spiriva and Symbicort. No exacerbations since last visit.   ROV 03/01/11 -- Severe COPD (GOLD 3-4). He has been doing well since last visit, still has exertional SOB. No exacerbations since last time. No exacerbations since last time. No cigarettes. Has been reliable with O2 on exertion, wonders if he needs it at night.   ROV 07/15/11 -- Severe COPD (GOLD 3-4). Has done well since last time. ONO showed that he does not need O2 with sleep, he is wearing 2L/min with exertion. He is on Spiriva + Symbicort. He feels that his breathing may be better since last time, now off chronic opioids.  No exacerbations since last time.   ROV 01/11/12 -- Severe COPD. Has been on Spiriva + Symbicort.  He has been doing well. Not exercising as much as before. Uses O2 with exertion on 2L/min. No flares or exacerbations. Uses SABA ~2x a day      Objective:   Physical Exam Filed Vitals:   01/11/12 1454  BP: 128/62  Pulse: 119  Temp: 98 F (36.7 C)   Gen: Pleasant, cachectic, in no distress,  normal affect on RA  ENT: No lesions,  mouth clear,  oropharynx clear, no postnasal drip  Neck: No JVD, no TMG, no carotid bruits  Lungs: Very distant, barrel chest,   Cardiovascular: RRR, heart sounds normal, no murmur or gallops, B LE trace edema  Musculoskeletal: No deformities, no cyanosis or clubbing  Neuro: alert, non  focal  Skin: Warm, no lesions or rashes   Assessment & Plan:  COPD Continue your Spiriva and Symbicort  Wear your oxygen with exertion Flu shot today Follow with Dr Delton Coombes in 6 months or sooner if you have any problems

## 2012-03-14 ENCOUNTER — Other Ambulatory Visit: Payer: Self-pay | Admitting: Emergency Medicine

## 2012-03-29 ENCOUNTER — Other Ambulatory Visit: Payer: Self-pay | Admitting: Emergency Medicine

## 2012-04-02 ENCOUNTER — Other Ambulatory Visit: Payer: Self-pay | Admitting: Emergency Medicine

## 2012-04-03 ENCOUNTER — Other Ambulatory Visit: Payer: Self-pay | Admitting: Emergency Medicine

## 2012-04-03 ENCOUNTER — Telehealth: Payer: Self-pay | Admitting: Emergency Medicine

## 2012-04-03 MED ORDER — ALBUTEROL SULFATE HFA 108 (90 BASE) MCG/ACT IN AERS: 2.0000 | INHALATION_SPRAY | Freq: Four times a day (QID) | RESPIRATORY_TRACT | Status: DC | PRN

## 2012-04-03 NOTE — Telephone Encounter (Signed)
EPIC show's RX's were e-prescribed. I called [facility] and confirmed they did receive these RX's. I called and made pt aware of this. Nothing further was needed

## 2012-06-16 ENCOUNTER — Other Ambulatory Visit: Payer: Self-pay | Admitting: Emergency Medicine

## 2012-07-05 NOTE — Telephone Encounter (Signed)
Proair rx sent to [facility] on 04/03/2012 #1 x 5.  Pt should still have refills left. Called [facility], spoke with [facility].  Was advised they do have the Proair rx from Jan 2014 and it is "on hold."  She will get rx ready for pt. Nothing further needed.

## 2012-07-10 ENCOUNTER — Ambulatory Visit (INDEPENDENT_AMBULATORY_CARE_PROVIDER_SITE_OTHER): Payer: Medicare Other | Admitting: Emergency Medicine

## 2012-07-10 ENCOUNTER — Encounter: Payer: Self-pay | Admitting: Emergency Medicine

## 2012-07-10 VITALS — BP 140/70 | HR 111 | Temp 98.0°F | Ht 70.0 in | Wt 148.0 lb

## 2012-07-10 DIAGNOSIS — J449 Chronic obstructive pulmonary disease, unspecified: Secondary | ICD-10-CM

## 2012-07-10 DIAGNOSIS — F17200 Nicotine dependence, unspecified, uncomplicated: Secondary | ICD-10-CM

## 2012-07-10 NOTE — Assessment & Plan Note (Signed)
Severe COPD, but clinically stable. Good med compliance, no exacerbations.  - same regimen - o2 w exertion. - rov 6 mon

## 2012-07-10 NOTE — Assessment & Plan Note (Signed)
Quit. Encouraged him to try to avoid 2nd hand smoke.

## 2012-07-10 NOTE — Progress Notes (Signed)
  Subjective:    Patient ID: Timothy Gonzalez, male    DOB: [birth year], 72 y.o.   MRN: [id number] HPI 72 yo man, severe COPD with tobacco abuse, seen by me last in 2007. Was admitted with exacerbation in April 2012, seen by me then. Discharged on Spiriva + Symbicort, Prn albuterol. Stopped smoking after discharge! Since he was discharged has done well. He has hx panic attacks, well managed and helping the breathing as well.   ROV 10/23/10 -- follows up for severe COPD. He reports that he is breathing better than last visit. Wearing o2 reliably with exertion. Has not restarted smoking. He is taking Spiriva and Symbicort. No exacerbations since last visit.   ROV 03/01/11 -- Severe COPD (GOLD 3-4). He has been doing well since last visit, still has exertional SOB. No exacerbations since last time. No exacerbations since last time. No cigarettes. Has been reliable with O2 on exertion, wonders if he needs it at night.   ROV 07/15/11 -- Severe COPD (GOLD 3-4). Has done well since last time. ONO showed that he does not need O2 with sleep, he is wearing 2L/min with exertion. He is on Spiriva + Symbicort. He feels that his breathing may be better since last time, now off chronic opioids.  No exacerbations since last time.   ROV 01/11/12 -- Severe COPD. Has been on Spiriva + Symbicort.  He has been doing well. Not exercising as much as before. Uses O2 with exertion on 2L/min. No flares or exacerbations. Uses SABA ~2x a day  ROV 07/10/12 -- regular f/u for Severe COPD. Has been on Spiriva + Symbicort.  He no longer smokes, but exposed to his wife and son. He is dealing with severe osteoporosis, compression fx's, back pain. He is using 2L/min with exertion. Uses albuterol few times a day.       Objective:   Physical Exam Filed Vitals:   07/10/12 1445  BP: 140/70  Pulse: 111  Temp: 98 F (36.7 C)   Gen: Pleasant, cachectic, in no distress,  normal affect on RA  ENT: No lesions,  mouth clear,  oropharynx  clear, no postnasal drip  Neck: No JVD, no TMG, no carotid bruits  Lungs: Very distant, barrel chest,   Cardiovascular: RRR, heart sounds normal, no murmur or gallops, 2-3 + B LE edema  Musculoskeletal: No deformities, no cyanosis or clubbing  Neuro: alert, non focal  Skin: Warm, no lesions or rashes   Assessment & Plan:  COPD Severe COPD, but clinically stable. Good med compliance, no exacerbations.  - same regimen - o2 w exertion. - rov 6 mon  TOBACCO ABUSE Quit. Encouraged him to try to avoid 2nd hand smoke.

## 2012-07-10 NOTE — Patient Instructions (Addendum)
Please continue your medications as you are taking them Flu shot every year Try your best to avoid cigarette smoke Follow with Dr Delton Coombes in 6 months or sooner if you have any problems

## 2012-10-21 ENCOUNTER — Other Ambulatory Visit: Payer: Self-pay | Admitting: Emergency Medicine

## 2012-10-31 ENCOUNTER — Other Ambulatory Visit: Payer: Self-pay | Admitting: Emergency Medicine

## 2012-12-28 ENCOUNTER — Other Ambulatory Visit: Payer: Self-pay | Admitting: Emergency Medicine

## 2013-01-23 ENCOUNTER — Ambulatory Visit (INDEPENDENT_AMBULATORY_CARE_PROVIDER_SITE_OTHER): Payer: Medicare Other | Admitting: Emergency Medicine

## 2013-01-23 ENCOUNTER — Encounter: Payer: Self-pay | Admitting: Emergency Medicine

## 2013-01-23 VITALS — BP 124/80 | HR 103 | Ht 73.0 in | Wt 144.6 lb

## 2013-01-23 DIAGNOSIS — J449 Chronic obstructive pulmonary disease, unspecified: Secondary | ICD-10-CM

## 2013-01-23 NOTE — Patient Instructions (Signed)
Please continue your inhaled medications as you are taking them Wear your oxygen with all exertion Flu shot today Follow with Dr Delton Coombes in 6 months or sooner if you have any problems

## 2013-01-23 NOTE — Assessment & Plan Note (Signed)
Very severe copd but remains stable.  - continue same regimen - o2 w exertion - flu shot today - rov6

## 2013-01-23 NOTE — Progress Notes (Signed)
  Subjective:    Patient ID: Timothy Gonzalez, male    DOB: February 10, 1941, 72 y.o.   MRN: [id number] HPI 72 yo man, severe COPD with tobacco abuse, seen by me last in 2007. Was admitted with exacerbation in April 2012, seen by me then. Discharged on Spiriva + Symbicort, Prn albuterol. Stopped smoking after discharge! Since he was discharged has done well. He has hx panic attacks, well managed and helping the breathing as well.   ROV 10/23/10 -- follows up for severe COPD. He reports that he is breathing better than last visit. Wearing o2 reliably with exertion. Has not restarted smoking. He is taking Spiriva and Symbicort. No exacerbations since last visit.   ROV 03/01/11 -- Severe COPD (GOLD 3-4). He has been doing well since last visit, still has exertional SOB. No exacerbations since last time. No exacerbations since last time. No cigarettes. Has been reliable with O2 on exertion, wonders if he needs it at night.   ROV 07/15/11 -- Severe COPD (GOLD 3-4). Has done well since last time. ONO showed that he does not need O2 with sleep, he is wearing 2L/min with exertion. He is on Spiriva + Symbicort. He feels that his breathing may be better since last time, now off chronic opioids.  No exacerbations since last time.   ROV 01/11/12 -- Severe COPD. Has been on Spiriva + Symbicort.  He has been doing well. Not exercising as much as before. Uses O2 with exertion on 2L/min. No flares or exacerbations. Uses SABA ~2x a day  ROV 07/10/12 -- regular f/u for Severe COPD. Has been on Spiriva + Symbicort.  He no longer smokes, but exposed to his wife and son. He is dealing with severe osteoporosis, compression fx's, back pain. He is using 2L/min with exertion. Uses albuterol few times a day.   ROV 01/23/13 -- severe COPD, hypoxemia. He has been able to get around some, uses an electric wheelchair at times. He is usually has more generalized trouble during the winter, although breathing remains stable. spiriva and  symbicort. Due for flu shot. Uses albuterol 3-4x a day. No flares since last time.      Objective:   Physical Exam Filed Vitals:   01/23/13 1601  BP: 124/80  Pulse: 103  Height: 6\' 1"  (1.854 m)  Weight: 144 lb 9.6 oz (65.59 kg)  SpO2: 92%   Gen: Pleasant, cachectic, in no distress,  normal affect on RA  ENT: No lesions,  mouth clear,  oropharynx clear, no postnasal drip  Neck: No JVD, no TMG, no carotid bruits  Lungs: Very distant, barrel chest,   Cardiovascular: RRR, heart sounds normal, no murmur or gallops, 2-3 + B ankle edema  Musculoskeletal: No deformities, no cyanosis or clubbing  Neuro: alert, non focal  Skin: Warm, no lesions or rashes   Assessment & Plan:  COPD Very severe copd but remains stable.  - continue same regimen - o2 w exertion - flu shot today - rov6

## 2013-04-09 ENCOUNTER — Telehealth: Payer: Self-pay | Admitting: Emergency Medicine

## 2013-04-09 NOTE — Telephone Encounter (Signed)
lmomtcb x1 for pt 

## 2013-04-09 NOTE — Telephone Encounter (Signed)
Yes this is OK. Thanks

## 2013-04-09 NOTE — Telephone Encounter (Signed)
RB are okay with this letter being written? Thanks.

## 2013-04-10 MED ORDER — ALBUTEROL SULFATE HFA 108 (90 BASE) MCG/ACT IN AERS: 2.0000 | INHALATION_SPRAY | Freq: Four times a day (QID) | RESPIRATORY_TRACT | Status: DC | PRN

## 2013-04-10 NOTE — Telephone Encounter (Signed)
[id number]  To get more details of what needs to be in the letter.  Bing, CMA

## 2013-04-10 NOTE — Telephone Encounter (Signed)
I spoke with the pt and he read a letter he received from insurance and it states that proventil is not covered and that he will need to speak with his doctor to get this changed. He provided me with the number on the form [phone]. I called this number to see what covered alternatives there are and proair is the preferred. I spoke with pt pharmacy because all I see on pt list if proair. According to pharmacy they had an old rx for proventil that the pt tried to fill. I advised this is not covered and to cancel this rx and to only fill proair. I provided and updated rx. Pt is aware.[place] Bing, CMA

## 2013-08-09 ENCOUNTER — Encounter: Payer: Self-pay | Admitting: Emergency Medicine

## 2013-08-09 ENCOUNTER — Ambulatory Visit (INDEPENDENT_AMBULATORY_CARE_PROVIDER_SITE_OTHER): Payer: Medicare Other | Admitting: Emergency Medicine

## 2013-08-09 VITALS — BP 128/74 | HR 87 | Ht 75.0 in | Wt 145.0 lb

## 2013-08-09 DIAGNOSIS — J449 Chronic obstructive pulmonary disease, unspecified: Secondary | ICD-10-CM

## 2013-08-09 NOTE — Progress Notes (Signed)
  Subjective:    Patient ID: Timothy Gonzalez, male    DOB: Oct 20, 1940, 73 y.o.   MRN: [id number] HPI 73 yo man, severe COPD with tobacco abuse, seen by me last in 2007. Was admitted with exacerbation in April 2012, seen by me then. Discharged on Spiriva + Symbicort, Prn albuterol. Stopped smoking after discharge! Since he was discharged has done well. He has hx panic attacks, well managed and helping the breathing as well.   ROV 10/23/10 -- follows up for severe COPD. He reports that he is breathing better than last visit. Wearing o2 reliably with exertion. Has not restarted smoking. He is taking Spiriva and Symbicort. No exacerbations since last visit.   ROV 03/01/11 -- Severe COPD (GOLD 3-4). He has been doing well since last visit, still has exertional SOB. No exacerbations since last time. No exacerbations since last time. No cigarettes. Has been reliable with O2 on exertion, wonders if he needs it at night.   ROV 07/15/11 -- Severe COPD (GOLD 3-4). Has done well since last time. ONO showed that he does not need O2 with sleep, he is wearing 2L/min with exertion. He is on Spiriva + Symbicort. He feels that his breathing may be better since last time, now off chronic opioids.  No exacerbations since last time.   ROV 01/11/12 -- Severe COPD. Has been on Spiriva + Symbicort.  He has been doing well. Not exercising as much as before. Uses O2 with exertion on 2L/min. No flares or exacerbations. Uses SABA ~2x a day  ROV 07/10/12 -- regular f/u for Severe COPD. Has been on Spiriva + Symbicort.  He no longer smokes, but exposed to his wife and son. He is dealing with severe osteoporosis, compression fx's, back pain. He is using 2L/min with exertion. Uses albuterol few times a day.   ROV 01/23/13 -- severe COPD, hypoxemia. He has been able to get around some, uses an electric wheelchair at times. He is usually has more generalized trouble during the winter, although breathing remains stable. spiriva and  symbicort. Due for flu shot. Uses albuterol 3-4x a day. No flares since last time.   ROV 08/09/13 -- severe COPD, hypoxemia. He has been doing well, gets around in a wheelchair. He has not had any flares. No URI's, no pred,no abx. Very rare cough during the pollen season this year.  On symbicort and spiriva, averages 2x SABA a day.       Objective:   Physical Exam Filed Vitals:   08/09/13 1444  BP: 128/74  Pulse: 87  Height: 6\' 3"  (1.905 m)  Weight: 145 lb (65.772 kg)  SpO2: 97%   Gen: Pleasant, cachectic, in no distress,  normal affect on RA  ENT: No lesions,  mouth clear,  oropharynx clear, no postnasal drip  Neck: No JVD, no TMG, no carotid bruits  Lungs: Very distant, barrel chest,   Cardiovascular: RRR, heart sounds normal, no murmur or gallops, 2+ B ankle edema  Musculoskeletal: No deformities, no cyanosis or clubbing  Neuro: alert, non focal  Skin: Warm, no lesions or rashes   Assessment & Plan:  COPD Very severe disease but remarkably well compensated on his current regimen. He uses wheelchair for most of his moving around. No flares.  - continue same - rov 6 - flu shot each year

## 2013-08-09 NOTE — Assessment & Plan Note (Signed)
Very severe disease but remarkably well compensated on his current regimen. He uses wheelchair for most of his moving around. No flares.  - continue same - rov 6 - flu shot each year

## 2013-08-09 NOTE — Patient Instructions (Signed)
Please continue your current medications as you are taking them Wear your oxygen  Get the Flu shot this Fall Call our office for any changes in your breathing - call early  Follow with Dr Lamonte Sakai in 6 months or sooner if you have any problems

## 2013-10-31 ENCOUNTER — Other Ambulatory Visit: Payer: Self-pay | Admitting: Emergency Medicine

## 2013-10-31 ENCOUNTER — Telehealth: Payer: Self-pay | Admitting: Emergency Medicine

## 2013-10-31 MED ORDER — TIOTROPIUM BROMIDE MONOHYDRATE 18 MCG IN CAPS: ORAL_CAPSULE | RESPIRATORY_TRACT | Status: AC

## 2013-10-31 NOTE — Telephone Encounter (Signed)
Called and spoke with pt and he is aware of refill that has been sent to the pharmacy.   

## 2013-11-28 ENCOUNTER — Other Ambulatory Visit: Payer: Self-pay | Admitting: Emergency Medicine

## 2014-01-02 ENCOUNTER — Other Ambulatory Visit: Payer: Self-pay | Admitting: Emergency Medicine

## 2014-01-29 ENCOUNTER — Other Ambulatory Visit: Payer: Self-pay | Admitting: Emergency Medicine

## 2014-02-12 ENCOUNTER — Ambulatory Visit (INDEPENDENT_AMBULATORY_CARE_PROVIDER_SITE_OTHER): Payer: Medicare Other | Admitting: Emergency Medicine

## 2014-02-12 ENCOUNTER — Encounter: Payer: Self-pay | Admitting: Emergency Medicine

## 2014-02-12 VITALS — BP 120/60 | HR 105 | Ht 74.0 in | Wt 145.0 lb

## 2014-02-12 DIAGNOSIS — J449 Chronic obstructive pulmonary disease, unspecified: Secondary | ICD-10-CM

## 2014-02-12 DIAGNOSIS — Z23 Encounter for immunization: Secondary | ICD-10-CM

## 2014-02-12 NOTE — Patient Instructions (Signed)
Please continue your current medications as you have been taking them Flu shot today Wear your oxygen at 2 L/m Follow with Dr Lamonte Sakai in April 2016

## 2014-02-12 NOTE — Progress Notes (Signed)
Subjective:    Patient ID: Timothy Gonzalez, male    DOB: 11/06/1940, 73 y.o.   MRN: [id number] HPI 73 yo man, severe COPD with tobacco abuse, seen by me last in 2007. Was admitted with exacerbation in April 2012, seen by me then. Discharged on Spiriva + Symbicort, Prn albuterol. Stopped smoking after discharge! Since he was discharged has done well. He has hx panic attacks, well managed and helping the breathing as well.   ROV 10/23/10 -- follows up for severe COPD. He reports that he is breathing better than last visit. Wearing o2 reliably with exertion. Has not restarted smoking. He is taking Spiriva and Symbicort. No exacerbations since last visit.   ROV 03/01/11 -- Severe COPD (GOLD 3-4). He has been doing well since last visit, still has exertional SOB. No exacerbations since last time. No exacerbations since last time. No cigarettes. Has been reliable with O2 on exertion, wonders if he needs it at night.   ROV 07/15/11 -- Severe COPD (GOLD 3-4). Has done well since last time. ONO showed that he does not need O2 with sleep, he is wearing 2L/min with exertion. He is on Spiriva + Symbicort. He feels that his breathing may be better since last time, now off chronic opioids.  No exacerbations since last time.   ROV 01/11/12 -- Severe COPD. Has been on Spiriva + Symbicort.  He has been doing well. Not exercising as much as before. Uses O2 with exertion on 2L/min. No flares or exacerbations. Uses SABA ~2x a day  ROV 07/10/12 -- regular f/u for Severe COPD. Has been on Spiriva + Symbicort.  He no longer smokes, but exposed to his wife and son. He is dealing with severe osteoporosis, compression fx's, back pain. He is using 2L/min with exertion. Uses albuterol few times a day.   ROV 01/23/13 -- severe COPD, hypoxemia. He has been able to get around some, uses an electric wheelchair at times. He is usually has more generalized trouble during the winter, although breathing remains stable. spiriva and  symbicort. Due for flu shot. Uses albuterol 3-4x a day. No flares since last time.   ROV 08/09/13 -- severe COPD, hypoxemia. He has been doing well, gets around in a wheelchair. He has not had any flares. No URI's, no pred,no abx. Very rare cough during the pollen season this year.  On symbicort and spiriva, averages 2x SABA a day.    ROV 02/12/14 -- follow up visit for severe COPD, hypoxemia. He has been stable since last time. Wears his O2 reliably. No exacerbations since last time. He is on spiriva and symbicort, uses albuterol single puff 1-2x a day. Flu shot today.      Objective:   Physical Exam Filed Vitals:   02/12/14 1523  BP: 120/60  Pulse: 105  Height: 6\' 2"  (1.88 m)  Weight: 145 lb (65.772 kg)  SpO2: 92%   Gen: Pleasant, cachectic, in no distress,  normal affect on RA  ENT: No lesions,  mouth clear,  oropharynx clear, no postnasal drip  Neck: No JVD, no TMG, no carotid bruits  Lungs: Very distant, barrel chest,   Cardiovascular: RRR, heart sounds normal, no murmur or gallops, 2+ B ankle edema  Musculoskeletal: No deformities, no cyanosis or clubbing  Neuro: alert, non focal  Skin: Warm, no lesions or rashes   Assessment & Plan:  COPD (chronic obstructive pulmonary disease) Please continue your current medications as you have been taking them Flu shot today Wear your oxygen at  2 L/m Follow with Dr Lamonte Sakai in April 2016 Will discuss [id number] with him next visit

## 2014-02-12 NOTE — Assessment & Plan Note (Signed)
Please continue your current medications as you have been taking them Flu shot today Wear your oxygen at 2 L/m Follow with Dr Lamonte Sakai in April 2016 Will discuss prevnar13 with him next visit

## 2014-02-12 NOTE — Addendum Note (Signed)
Addended by: Mathis Dad on: 02/12/2014 04:08 PM   Modules accepted: Orders

## 2014-02-12 NOTE — Addendum Note (Signed)
Addended by: Lilli Few on: 02/12/2014 04:01 PM   Modules accepted: Orders

## 2014-03-26 ENCOUNTER — Other Ambulatory Visit: Payer: Self-pay | Admitting: Emergency Medicine

## 2014-04-26 ENCOUNTER — Emergency Department (HOSPITAL_COMMUNITY): Payer: Medicare Other

## 2014-04-26 ENCOUNTER — Inpatient Hospital Stay (HOSPITAL_COMMUNITY)
Admission: EM | Admit: 2014-04-26 | Discharge: 2014-05-07 | DRG: 981 | Disposition: A | Discharge status: Skilled Nursing Facility | Payer: Medicare Other | Attending: Internal Medicine | Admitting: Internal Medicine

## 2014-04-26 ENCOUNTER — Encounter (HOSPITAL_COMMUNITY): Payer: Self-pay | Admitting: *Deleted

## 2014-04-26 DIAGNOSIS — Z9981 Dependence on supplemental oxygen: Secondary | ICD-10-CM

## 2014-04-26 DIAGNOSIS — R64 Cachexia: Secondary | ICD-10-CM | POA: Diagnosis present

## 2014-04-26 DIAGNOSIS — F17200 Nicotine dependence, unspecified, uncomplicated: Secondary | ICD-10-CM

## 2014-04-26 DIAGNOSIS — G9589 Other specified diseases of spinal cord: Secondary | ICD-10-CM

## 2014-04-26 DIAGNOSIS — C779 Secondary and unspecified malignant neoplasm of lymph node, unspecified: Secondary | ICD-10-CM | POA: Diagnosis present

## 2014-04-26 DIAGNOSIS — R160 Hepatomegaly, not elsewhere classified: Secondary | ICD-10-CM | POA: Diagnosis present

## 2014-04-26 DIAGNOSIS — Z515 Encounter for palliative care: Secondary | ICD-10-CM | POA: Insufficient documentation

## 2014-04-26 DIAGNOSIS — C787 Secondary malignant neoplasm of liver and intrahepatic bile duct: Secondary | ICD-10-CM | POA: Diagnosis present

## 2014-04-26 DIAGNOSIS — R636 Underweight: Secondary | ICD-10-CM | POA: Diagnosis present

## 2014-04-26 DIAGNOSIS — G839 Paralytic syndrome, unspecified: Secondary | ICD-10-CM

## 2014-04-26 DIAGNOSIS — Z7901 Long term (current) use of anticoagulants: Secondary | ICD-10-CM | POA: Diagnosis not present

## 2014-04-26 DIAGNOSIS — I10 Essential (primary) hypertension: Secondary | ICD-10-CM | POA: Diagnosis not present

## 2014-04-26 DIAGNOSIS — J42 Unspecified chronic bronchitis: Secondary | ICD-10-CM | POA: Diagnosis not present

## 2014-04-26 DIAGNOSIS — J45909 Unspecified asthma, uncomplicated: Secondary | ICD-10-CM | POA: Diagnosis present

## 2014-04-26 DIAGNOSIS — R531 Weakness: Secondary | ICD-10-CM | POA: Diagnosis present

## 2014-04-26 DIAGNOSIS — R140 Abdominal distension (gaseous): Secondary | ICD-10-CM | POA: Diagnosis present

## 2014-04-26 DIAGNOSIS — R109 Unspecified abdominal pain: Secondary | ICD-10-CM

## 2014-04-26 DIAGNOSIS — Z86718 Personal history of other venous thrombosis and embolism: Secondary | ICD-10-CM | POA: Diagnosis not present

## 2014-04-26 DIAGNOSIS — C3490 Malignant neoplasm of unspecified part of unspecified bronchus or lung: Secondary | ICD-10-CM | POA: Insufficient documentation

## 2014-04-26 DIAGNOSIS — J418 Mixed simple and mucopurulent chronic bronchitis: Secondary | ICD-10-CM | POA: Diagnosis not present

## 2014-04-26 DIAGNOSIS — R000 Tachycardia, unspecified: Secondary | ICD-10-CM | POA: Diagnosis present

## 2014-04-26 DIAGNOSIS — C3491 Malignant neoplasm of unspecified part of right bronchus or lung: Secondary | ICD-10-CM

## 2014-04-26 DIAGNOSIS — G8220 Paraplegia, unspecified: Principal | ICD-10-CM | POA: Diagnosis present

## 2014-04-26 DIAGNOSIS — Z79899 Other long term (current) drug therapy: Secondary | ICD-10-CM

## 2014-04-26 DIAGNOSIS — C3411 Malignant neoplasm of upper lobe, right bronchus or lung: Secondary | ICD-10-CM | POA: Diagnosis present

## 2014-04-26 DIAGNOSIS — D649 Anemia, unspecified: Secondary | ICD-10-CM | POA: Diagnosis present

## 2014-04-26 DIAGNOSIS — C7951 Secondary malignant neoplasm of bone: Secondary | ICD-10-CM | POA: Diagnosis present

## 2014-04-26 DIAGNOSIS — Z9181 History of falling: Secondary | ICD-10-CM | POA: Diagnosis not present

## 2014-04-26 DIAGNOSIS — Z87891 Personal history of nicotine dependence: Secondary | ICD-10-CM | POA: Diagnosis not present

## 2014-04-26 DIAGNOSIS — M8000XA Age-related osteoporosis with current pathological fracture, unspecified site, initial encounter for fracture: Secondary | ICD-10-CM | POA: Diagnosis present

## 2014-04-26 DIAGNOSIS — C7952 Secondary malignant neoplasm of bone marrow: Secondary | ICD-10-CM

## 2014-04-26 DIAGNOSIS — R222 Localized swelling, mass and lump, trunk: Secondary | ICD-10-CM

## 2014-04-26 DIAGNOSIS — R918 Other nonspecific abnormal finding of lung field: Secondary | ICD-10-CM | POA: Diagnosis not present

## 2014-04-26 DIAGNOSIS — Z96641 Presence of right artificial hip joint: Secondary | ICD-10-CM | POA: Diagnosis present

## 2014-04-26 DIAGNOSIS — G959 Disease of spinal cord, unspecified: Secondary | ICD-10-CM | POA: Diagnosis not present

## 2014-04-26 DIAGNOSIS — Z7951 Long term (current) use of inhaled steroids: Secondary | ICD-10-CM | POA: Diagnosis not present

## 2014-04-26 DIAGNOSIS — G9520 Unspecified cord compression: Secondary | ICD-10-CM | POA: Diagnosis present

## 2014-04-26 DIAGNOSIS — Z510 Encounter for antineoplastic radiation therapy: Secondary | ICD-10-CM | POA: Diagnosis present

## 2014-04-26 DIAGNOSIS — I151 Hypertension secondary to other renal disorders: Secondary | ICD-10-CM | POA: Diagnosis not present

## 2014-04-26 DIAGNOSIS — G893 Neoplasm related pain (acute) (chronic): Secondary | ICD-10-CM | POA: Diagnosis present

## 2014-04-26 DIAGNOSIS — R609 Edema, unspecified: Secondary | ICD-10-CM | POA: Diagnosis not present

## 2014-04-26 DIAGNOSIS — C7989 Secondary malignant neoplasm of other specified sites: Secondary | ICD-10-CM | POA: Diagnosis present

## 2014-04-26 DIAGNOSIS — J449 Chronic obstructive pulmonary disease, unspecified: Secondary | ICD-10-CM | POA: Diagnosis not present

## 2014-04-26 DIAGNOSIS — E43 Unspecified severe protein-calorie malnutrition: Secondary | ICD-10-CM | POA: Diagnosis present

## 2014-04-26 DIAGNOSIS — M5104 Intervertebral disc disorders with myelopathy, thoracic region: Secondary | ICD-10-CM | POA: Diagnosis not present

## 2014-04-26 LAB — CBC WITH DIFFERENTIAL/PLATELET
BASOS PCT: 0 % (ref 0–1)
Basophils Absolute: 0 10*3/uL (ref 0.0–0.1)
Eosinophils Absolute: 0 10*3/uL (ref 0.0–0.7)
Eosinophils Relative: 1 % (ref 0–5)
HCT: 33.2 % — ABNORMAL LOW (ref 39.0–52.0)
HEMOGLOBIN: 10.8 g/dL — AB (ref 13.0–17.0)
LYMPHS ABS: 0.7 10*3/uL (ref 0.7–4.0)
Lymphocytes Relative: 13 % (ref 12–46)
MCH: 30.3 pg (ref 26.0–34.0)
MCHC: 32.5 g/dL (ref 30.0–36.0)
MCV: 93.3 fL (ref 78.0–100.0)
Monocytes Absolute: 0.5 10*3/uL (ref 0.1–1.0)
Monocytes Relative: 9 % (ref 3–12)
Neutro Abs: 4.3 10*3/uL (ref 1.7–7.7)
Neutrophils Relative %: 77 % (ref 43–77)
PLATELETS: 230 10*3/uL (ref 150–400)
RBC: 3.56 MIL/uL — ABNORMAL LOW (ref 4.22–5.81)
RDW: 15.3 % (ref 11.5–15.5)
WBC: 5.6 10*3/uL (ref 4.0–10.5)

## 2014-04-26 LAB — URINALYSIS, ROUTINE W REFLEX MICROSCOPIC
BILIRUBIN URINE: NEGATIVE
GLUCOSE, UA: NEGATIVE mg/dL
HGB URINE DIPSTICK: NEGATIVE
Ketones, ur: 15 mg/dL — AB
Leukocytes, UA: NEGATIVE
Nitrite: NEGATIVE
Protein, ur: NEGATIVE mg/dL
SPECIFIC GRAVITY, URINE: 1.021 (ref 1.005–1.030)
UROBILINOGEN UA: 1 mg/dL (ref 0.0–1.0)
pH: 5.5 (ref 5.0–8.0)

## 2014-04-26 LAB — COMPREHENSIVE METABOLIC PANEL
ALBUMIN: 2.7 g/dL — AB (ref 3.5–5.2)
ALT: 37 U/L (ref 0–53)
AST: 84 U/L — AB (ref 0–37)
Alkaline Phosphatase: 221 U/L — ABNORMAL HIGH (ref 39–117)
Anion gap: 5 (ref 5–15)
BILIRUBIN TOTAL: 0.8 mg/dL (ref 0.3–1.2)
BUN: 37 mg/dL — AB (ref 6–23)
CO2: 37 mmol/L — ABNORMAL HIGH (ref 19–32)
Calcium: 9.3 mg/dL (ref 8.4–10.5)
Chloride: 95 mmol/L — ABNORMAL LOW (ref 96–112)
Creatinine, Ser: 0.77 mg/dL (ref 0.50–1.35)
GFR calc Af Amer: >90 mL/min (ref >90)
GFR calc non Af Amer: 89 mL/min — ABNORMAL LOW (ref >90)
Glucose, Bld: 105 mg/dL — ABNORMAL HIGH (ref 70–99)
POTASSIUM: 4.4 mmol/L (ref 3.5–5.1)
Sodium: 137 mmol/L (ref 135–145)
Total Protein: 6.4 g/dL (ref 6.0–8.3)

## 2014-04-26 MED ORDER — MORPHINE SULFATE 2 MG/ML IJ SOLN
1.0000 mg | Freq: Once | INTRAMUSCULAR | Status: DC
  Filled 2014-04-26: qty 1

## 2014-04-26 MED ORDER — ALBUTEROL SULFATE (2.5 MG/3ML) 0.083% IN NEBU
5.0000 mg | INHALATION_SOLUTION | Freq: Once | RESPIRATORY_TRACT | Status: DC
  Filled 2014-04-26: qty 6

## 2014-04-26 MED ORDER — OXYCODONE-ACETAMINOPHEN 5-325 MG PO TABS
1.0000 | ORAL_TABLET | Freq: Once | ORAL | Status: DC
  Filled 2014-04-26: qty 1

## 2014-04-26 NOTE — Consult Note (Signed)
Reason for Consult:paralysis Referring Physician: EDP  Timothy Gonzalez is an 74 y.o. male.   HPI:   74 year old white male with a history of COPD who presented to the ER today with a 6 day history of flaccid paralysis of the lower extremities. He describes a fall a few days ago. He then noted numbness in the legs followed by progressive weakness. He has had urinary retention. He has had chronic back pain. He cannot give a good story as to why it took so long to come in after 6 days of paralysis. CT scan of the thoracic spine shows a T8 paraspinous mass assistant with metastatic cancer with destruction of the posterior elements and pedicle and canal narrowing. Neurosurgical evaluation was requested. He describes his numbness as from the umbilicus down.  A Foley catheter was placed in the ER.  Past Medical History  Diagnosis Date  . COPD (chronic obstructive pulmonary disease)   . Celiac disease   . Asthma     childhood    Past Surgical History  Procedure Laterality Date  . Total hip arthroplasty  2005    right     Allergies  Allergen Reactions  . Barley Grass   . Wheat     History  Substance Use Topics  . Smoking status: Former Smoker -- 1.00 packs/day for 48 years    Types: Cigarettes    Quit date: 06/14/2010  . Smokeless tobacco: Never Used  . Alcohol Use: Yes     Comment: rare    History reviewed. No pertinent family history.   Review of Systems  Positive ROS: neg  All other systems have been reviewed and were otherwise negative with the exception of those mentioned in the HPI and as above.  Objective: Vital signs in last 24 hours: Temp:  [97.8 F (36.6 C)] 97.8 F (36.6 C) (02/12 1600) Pulse Rate:  [114-119] 114 (02/12 1945) Resp:  [20-25] 25 (02/12 1945) BP: (101-122)/(61-66) 115/66 mmHg (02/12 1945) SpO2:  [93 %-100 %] 100 % (02/12 1945)  General Appearance: Alert, cooperative, no distress,  Cachectic frail appearing white male Head: Normocephalic, without  obvious abnormality, atraumatic Eyes: PERRL, conjunctiva/corneas clear, EOM's intact   Neck: Supple, symmetrical, trachea midline Lungs:  Respirations mildly labored Heart:  Tachycardic but regular Extremities:  Significant pitting edema of the  leftlower extremity and significant atrophy of the right lower extremity with thin skin and weeping wounds   NEUROLOGIC:   Mental status: A&O x4, no aphasia, good attention span, Memory and fund of knowledge seem okay Motor Exam -  Thin upper extremities but decent strength,  Flaccid paralysis of lower extremities Sensory Exam -  Some residual gross touch in the feet and along the right shin,  Overall deficit is from the umbilicus distally Reflexes:  Flaccid lower extremities Coordination -  Not tested Gait -  Not tested Balance -  Not tested Cranial Nerves: I: smell Not tested  II: visual acuity  OS: na    OD: na  II: visual fields Full to confrontation  II: pupils Equal, round, reactive to light  III,VII: ptosis None  III,IV,VI: extraocular muscles  Full ROM  V: mastication Normal  V: facial light touch sensation  Normal  V,VII: corneal reflex  Present  VII: facial muscle function - upper  Normal  VII: facial muscle function - lower Normal  VIII: hearing Not tested  IX: soft palate elevation  Normal  IX,X: gag reflex Present  XI: trapezius strength  5/5  XI: sternocleidomastoid  strength 5/5  XI: neck flexion strength  5/5  XII: tongue strength  Normal    Data Review Lab Results  Component Value Date   WBC 5.6 04/26/2014   HGB 10.8* 04/26/2014   HCT 33.2* 04/26/2014   MCV 93.3 04/26/2014   PLT 230 04/26/2014   Lab Results  Component Value Date   NA 137 04/26/2014   K 4.4 04/26/2014   CL 95* 04/26/2014   CO2 37* 04/26/2014   BUN 37* 04/26/2014   CREATININE 0.77 04/26/2014   GLUCOSE 105* 04/26/2014   No results found for: INR, PROTIME  Radiology: Dg Chest Port 1 View  04/26/2014   CLINICAL DATA:  74 year old male  with numbness from the waist down. Wheelchair bound. Initial encounter.  EXAM: PORTABLE CHEST - 1 VIEW  COMPARISON:  06/18/2010.  FINDINGS: Three portable views of the chest. Chronic but progressed tortuosity of the thoracic aorta. Cardiac size and other mediastinal contours remain within normal limits. Chronic pulmonary hyperinflation. Multilevel thoracic compression fractures. Osteopenia.  In the peripheral left upper chest there is a rounded extrapleural type density which may be associated with the lateral fourth or fifth ribs, uncertain.  Otherwise allowing for portable technique the lungs are clear.  IMPRESSION: 1. Chronic but progressed tortuosity of the thoracic aorta. 2. Osteopenia with multilevel thoracic compression fractures. Possible left lateral fourth or fifth expansile rib lesion (arrow). 3. Chronic pulmonary hyperinflation, no definite acute pulmonary opacity.   Electronically Signed   By: Genevie Ann M.D.   On: 04/26/2014 20:24     Assessment/Plan:  CT scan of the thoracic and lumbar spine was reviewed. The radiologist has yet to read the films formally. 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 However the most impressive lesion is at T8 region  Where he has destruction of the right pedicle and right side of the vertebral body as well as the posterior elements from a soft tissue mass which fills the canal and the paraspinous musculature on the right.   I suspect he has a metastatic lesion to T8. Based on the fact that he has had flaccid paralysis for 6 days I do not believe any surgical intervention is warranted. I think it would be futile. I do not think that it would allow for return of motor function.  Unfortunately,  It  has simply been too long.  I also believe  If he was ever intubated for surgery he would not be extubated because of his COPD.   He should be admitted to the medicine service with oncology consulting. He will likely need  Workup to  be evaluated for primary lesion, Biopsy of a lesion  To determine pathology,  And then potentially treatment with oncology. He may be a candidate for radiation therapy to the spine,   Though I think this will turn out to be palliative in nature.  He is quite cachectic and frail.   Neveen Daponte S 04/26/2014 8:57 PM

## 2014-04-26 NOTE — ED Notes (Signed)
Pt states he fell out of a chair on Feb 6th 2016 and has been unable to move bilateral lower extremities since then. Pt has 3+ edema to LT foot and edema to RT ankle and unable to feel touch. Unable to palpate LT pedal pulse.

## 2014-04-26 NOTE — ED Provider Notes (Signed)
CSN: [id number]     Arrival date & time 04/26/14  1543 History   First MD Initiated Contact with Patient 04/26/14 1837     Chief Complaint  Patient presents with  . Numbness     The history is provided by the patient. No language interpreter was used.   Timothy Gonzalez is here for evaluation of numbness and weakness in his legs.   He reports having multiple falls in the month of December.   He developed numbness in bilateral legs starting in February. Since February 6 he has been unable to move either leg. He is numb from the level of his belly button to his feet bilaterally. At baseline he typically walks about 75 feet with a walker. He has a wheelchair for his COPD. He denies any fevers but has occasional hot flashes. He has urinary incontinence and difficulty urinating when he tries to go. He saw his PCP today who referred him to the emergency department for further evaluation. Symptoms are severe and constant.  Past Medical History  Diagnosis Date  . COPD (chronic obstructive pulmonary disease)   . Celiac disease   . Asthma     childhood   Past Surgical History  Procedure Laterality Date  . Total hip arthroplasty  2005    right    No family history on file. History  Substance Use Topics  . Smoking status: Former Smoker -- 1.00 packs/day for 48 years    Types: Cigarettes    Quit date: 06/14/2010  . Smokeless tobacco: Never Used  . Alcohol Use: Yes     Comment: rare    Review of Systems  All other systems reviewed and are negative.     Allergies  Barley grass and Wheat  Home Medications   Prior to Admission medications   Medication Sig Start Date End Date Taking? Authorizing Provider  albuterol (PROVENTIL) (2.5 MG/3ML) 0.083% nebulizer solution Take 2.5 mg by nebulization every 4 (four) hours as needed.      Historical Provider, MD  alendronate (FOSAMAX) 70 MG tablet Once weekly 10/20/10   Historical Provider, MD  amLODipine (NORVASC) 5 MG tablet Take 5 mg by mouth  daily.      Historical Provider, MD  budesonide-formoterol (SYMBICORT) 160-4.5 MCG/ACT inhaler Inhale 2 puffs into the lungs 2 (two) times daily. 08/23/11   Collene Gobble, MD  Calcium Carbonate-Vitamin D (CALTRATE 600+D) 600-400 MG-UNIT per tablet Take 1 tablet by mouth daily.     Historical Provider, MD  Chelated Magnesium 100 MG TABS Take 1 tablet by mouth daily. 400 mg    Historical Provider, MD  Multiple Vitamin (MULTIVITAMIN) tablet Take 1 tablet by mouth daily.      Historical Provider, MD  naproxen sodium (ANAPROX) 220 MG tablet Take 220 mg by mouth 2 (two) times daily with a meal.    Historical Provider, MD  PROAIR HFA 108 (90 BASE) MCG/ACT inhaler INHALE 1 TO 2 PUFFS EVERY 6 HOURS AS NEEDED 01/02/14   Collene Gobble, MD  SYMBICORT 160-4.5 MCG/ACT inhaler INHALE 2 PUFFS BY MOUTH TWICE A DAY 03/28/14   Collene Gobble, MD  tiotropium (SPIRIVA HANDIHALER) 18 MCG inhalation capsule PLACE 1 CAPSULE (18 MCG TOTAL) INTO INHALER AND INHALE DAILY. 10/31/13   Collene Gobble, MD  vitamin B-12 (CYANOCOBALAMIN) 1000 MCG tablet Take 1,000 mcg by mouth daily.     Historical Provider, MD   BP 122/66 mmHg  Pulse 116  Temp(Src) 97.8 F (36.6 C) (Oral)  Resp  20  SpO2 93% Physical Exam  Constitutional: He is oriented to person, place, and time.   cachectic  HENT:  Head: Normocephalic and atraumatic.  Cardiovascular: Regular rhythm.   No murmur heard.  tachycardic  Pulmonary/Chest: Effort normal.   Tachypnea, with decreased air movement bilaterally  Abdominal: Soft. There is no tenderness. There is no rebound and no guarding.  Musculoskeletal:   3+ pitting edema of the left lower extremity, 1+ pitting edema of the right lower extremity.  Neurological: He is alert and oriented to person, place, and time.   Decreased sensation to light touch in the right lower extremity, absent sensation to light touch in the left leg. 0 out of 5 strength in bilateral lower extremities.  Skin: Skin is warm and dry.   Psychiatric: He has a normal mood and affect. His behavior is normal.  Nursing note and vitals reviewed.   ED Course  Procedures (including critical care time) Labs Review Labs Reviewed  CBC WITH DIFFERENTIAL/PLATELET - Abnormal; Notable for the following:    RBC 3.56 (*)    Hemoglobin 10.8 (*)    HCT 33.2 (*)    All other components within normal limits  COMPREHENSIVE METABOLIC PANEL - Abnormal; Notable for the following:    Chloride 95 (*)    CO2 37 (*)    Glucose, Bld 105 (*)    BUN 37 (*)    Albumin 2.7 (*)    AST 84 (*)    Alkaline Phosphatase 221 (*)    GFR calc non Af Amer 89 (*)    All other components within normal limits  URINALYSIS, ROUTINE W REFLEX MICROSCOPIC - Abnormal; Notable for the following:    Ketones, ur 15 (*)    All other components within normal limits  URINE CULTURE    Imaging Review Ct Thoracic Spine Wo Contrast  04/26/2014   CLINICAL DATA:  74 year old who fell out of wheelchair 6 days ago now unable to move bilateral lower extremities and numbness from the waist down. Initial encounter.  EXAM: CT THORACIC AND LUMBAR SPINE WITHOUT CONTRAST  TECHNIQUE: Multidetector CT imaging of the thoracic and lumbar spine was performed without contrast. Multiplanar CT image reconstructions were also generated.  COMPARISON:  Chest radiographs 1928 hr the same day. Chest CTA 11/10/2005.  FINDINGS: CT THORACIC SPINE FINDINGS  Spiculated right upper lobe lung mass measuring 31 mm longus dimension. Severe underlying emphysema. No definite mediastinal or hilar lymphadenopathy.  Diffuse osteopenia with thoracolumbar compression fractures sparing only the T1, and T3 levels.  At the T8 level there is a destructive soft tissue mass which has eroded most of the T8 posterior elements, the right T8 pedicle, and has very likely invaded the thoracic spinal canal. Suspect thoracic spinal cord compression at this level in this setting.  No other destructive osseous lesion identified in  the thoracic spine.  CT LUMBAR SPINE FINDINGS  Osteopenia and diffuse lumbar compression fractures.  Superimposed destructive mass of the right sacral ala which has eroded into the right sacral epidural space and likely affects the exiting right S1 nerve root.  Aortoiliac calcified atherosclerosis noted. There is a left paraspinal muscle mass at the L2-L3 level measuring about 34 mm diameter. Limited evaluation of the abdominal and pelvic viscera, with no acute visceral abnormality is evident.  IMPRESSION: 1. Spiculated right lung mass and destructive T8 spinal mass, most compatible with metastatic lung cancer. Spinal canal invasion at T8, almost certainly with cord compression in this setting. Thoracic MRI (without and  with contrast preferred) would evaluate further if necessary. 2. Destructive soft tissue mass right sacral ala involving the right S1 nerve. 3. 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 4. Diffuse spinal compression fractures sparing only the T1 and T3 levels in the thoracolumbar spine. Critical Value/emergent results were called by telephone at the time of interpretation on 04/26/2014 at 9:21 pm to Dr. Quintella Reichert , who verbally acknowledged these results.   Electronically Signed   By: Genevie Ann M.D.   On: 04/26/2014 21:22   Ct Lumbar Spine Wo Contrast  04/26/2014   CLINICAL DATA:  74 year old who fell out of wheelchair 6 days ago now unable to move bilateral lower extremities and numbness from the waist down. Initial encounter.  EXAM: CT THORACIC AND LUMBAR SPINE WITHOUT CONTRAST  TECHNIQUE: Multidetector CT imaging of the thoracic and lumbar spine was performed without contrast. Multiplanar CT image reconstructions were also generated.  COMPARISON:  Chest radiographs 1928 hr the same day. Chest CTA 11/10/2005.  FINDINGS: CT THORACIC SPINE FINDINGS  Spiculated right upper lobe lung mass measuring 31 mm longus dimension. Severe underlying emphysema. No definite mediastinal or hilar  lymphadenopathy.  Diffuse osteopenia with thoracolumbar compression fractures sparing only the T1, and T3 levels.  At the T8 level there is a destructive soft tissue mass which has eroded most of the T8 posterior elements, the right T8 pedicle, and has very likely invaded the thoracic spinal canal. Suspect thoracic spinal cord compression at this level in this setting.  No other destructive osseous lesion identified in the thoracic spine.  CT LUMBAR SPINE FINDINGS  Osteopenia and diffuse lumbar compression fractures.  Superimposed destructive mass of the right sacral ala which has eroded into the right sacral epidural space and likely affects the exiting right S1 nerve root.  Aortoiliac calcified atherosclerosis noted. There is a left paraspinal muscle mass at the L2-L3 level measuring about 34 mm diameter. Limited evaluation of the abdominal and pelvic viscera, with no acute visceral abnormality is evident.  IMPRESSION: 1. Spiculated right lung mass and destructive T8 spinal mass, most compatible with metastatic lung cancer. Spinal canal invasion at T8, almost certainly with cord compression in this setting. Thoracic MRI (without and with contrast preferred) would evaluate further if necessary. 2. Destructive soft tissue mass right sacral ala involving the right S1 nerve. 3. 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 4. Diffuse spinal compression fractures sparing only the T1 and T3 levels in the thoracolumbar spine. Critical Value/emergent results were called by telephone at the time of interpretation on 04/26/2014 at 9:21 pm to Dr. Quintella Reichert , who verbally acknowledged these results.   Electronically Signed   By: Genevie Ann M.D.   On: 04/26/2014 21:22   Dg Chest Port 1 View  04/26/2014   CLINICAL DATA:  74 year old male with numbness from the waist down. Wheelchair bound. Initial encounter.  EXAM: PORTABLE CHEST - 1 VIEW  COMPARISON:  06/18/2010.  FINDINGS: Three portable views of the chest.  Chronic but progressed tortuosity of the thoracic aorta. Cardiac size and other mediastinal contours remain within normal limits. Chronic pulmonary hyperinflation. Multilevel thoracic compression fractures. Osteopenia.  In the peripheral left upper chest there is a rounded extrapleural type density which may be associated with the lateral fourth or fifth ribs, uncertain.  Otherwise allowing for portable technique the lungs are clear.  IMPRESSION: 1. Chronic but progressed tortuosity of the thoracic aorta. 2. Osteopenia with multilevel thoracic compression fractures. Possible left lateral fourth or fifth  expansile rib lesion (arrow). 3. Chronic pulmonary hyperinflation, no definite acute pulmonary opacity.   Electronically Signed   By: Genevie Ann M.D.   On: 04/26/2014 20:24     EKG Interpretation   Date/Time:  Friday April 26 2014 19:48:02 EST Ventricular Rate:  114 PR Interval:  153 QRS Duration: 117 QT Interval:  344 QTC Calculation: 474 R Axis:   -124 Text Interpretation:  Sinus tachycardia Right bundle branch block Inferior  infarct, acute (RCA) Probable posterior infarct, acute Lateral leads are  also involved Probable RV involvement, suggest recording right precordial  leads Baseline wander in lead(s) V3 Abnormal ekg Confirmed by BEATON  MD,  ROBERT ([id number]) on 04/26/2014 7:54:32 PM      MDM   Final diagnoses:  Paralysis  Metastatic lung cancer (metastasis from lung to other site), right     patient here for evaluation of lower extremity paralysis that has been present for about a week. Patient with flaccid paralysis on exam. Discussed with Dr. Ronnald Ramp with neurosurgery who recommended CT lumbar and thoracic spine as patient is unlikely to tolerate MRI.   CT scan is consistent with metastatic lesions that likely result in cord compression. Results were discussed with the patient. Discussed with hospitalist regarding admission for further evaluation and management of metastatic cancer,  likely lung primary with lower extremity paralysis and need for further workup. The hospitalist has evaluated the patient and recommends transfer to [facility] for possible palliative radiation therapy.    Quintella Reichert, MD 04/27/14 [phone]

## 2014-04-26 NOTE — ED Notes (Signed)
The pt has had bi-lateral leg numbness since jan 28th or numbness from the waist down.  The pt is wheelchaiur bound from ??? Osteoporosis .  He is on hoime 02 from [facility].  [name] He  Was sent from the doctors office

## 2014-04-27 ENCOUNTER — Inpatient Hospital Stay (HOSPITAL_COMMUNITY): Payer: Medicare Other

## 2014-04-27 ENCOUNTER — Encounter (HOSPITAL_COMMUNITY): Payer: Self-pay | Admitting: Internal Medicine

## 2014-04-27 DIAGNOSIS — C799 Secondary malignant neoplasm of unspecified site: Secondary | ICD-10-CM

## 2014-04-27 DIAGNOSIS — G8220 Paraplegia, unspecified: Secondary | ICD-10-CM | POA: Diagnosis present

## 2014-04-27 DIAGNOSIS — E43 Unspecified severe protein-calorie malnutrition: Secondary | ICD-10-CM

## 2014-04-27 DIAGNOSIS — G9520 Unspecified cord compression: Secondary | ICD-10-CM

## 2014-04-27 DIAGNOSIS — C3490 Malignant neoplasm of unspecified part of unspecified bronchus or lung: Secondary | ICD-10-CM | POA: Insufficient documentation

## 2014-04-27 DIAGNOSIS — G9589 Other specified diseases of spinal cord: Secondary | ICD-10-CM | POA: Diagnosis present

## 2014-04-27 DIAGNOSIS — J42 Unspecified chronic bronchitis: Secondary | ICD-10-CM

## 2014-04-27 DIAGNOSIS — R918 Other nonspecific abnormal finding of lung field: Secondary | ICD-10-CM | POA: Diagnosis present

## 2014-04-27 DIAGNOSIS — G959 Disease of spinal cord, unspecified: Secondary | ICD-10-CM

## 2014-04-27 DIAGNOSIS — I10 Essential (primary) hypertension: Secondary | ICD-10-CM | POA: Diagnosis present

## 2014-04-27 DIAGNOSIS — C3491 Malignant neoplasm of unspecified part of right bronchus or lung: Secondary | ICD-10-CM

## 2014-04-27 LAB — CBC WITH DIFFERENTIAL/PLATELET
BASOS ABS: 0 10*3/uL (ref 0.0–0.1)
Basophils Relative: 0 % (ref 0–1)
Eosinophils Absolute: 0.1 10*3/uL (ref 0.0–0.7)
Eosinophils Relative: 1 % (ref 0–5)
HCT: 30.4 % — ABNORMAL LOW (ref 39.0–52.0)
Hemoglobin: 10.1 g/dL — ABNORMAL LOW (ref 13.0–17.0)
Lymphocytes Relative: 11 % — ABNORMAL LOW (ref 12–46)
Lymphs Abs: 0.7 10*3/uL (ref 0.7–4.0)
MCH: 31.1 pg (ref 26.0–34.0)
MCHC: 33.2 g/dL (ref 30.0–36.0)
MCV: 93.5 fL (ref 78.0–100.0)
MONO ABS: 0.8 10*3/uL (ref 0.1–1.0)
Monocytes Relative: 12 % (ref 3–12)
NEUTROS ABS: 4.6 10*3/uL (ref 1.7–7.7)
Neutrophils Relative %: 76 % (ref 43–77)
Platelets: 235 10*3/uL (ref 150–400)
RBC: 3.25 MIL/uL — AB (ref 4.22–5.81)
RDW: 15.6 % — AB (ref 11.5–15.5)
WBC: 6 10*3/uL (ref 4.0–10.5)

## 2014-04-27 LAB — COMPREHENSIVE METABOLIC PANEL
ALBUMIN: 2.7 g/dL — AB (ref 3.5–5.2)
ALT: 35 U/L (ref 0–53)
AST: 83 U/L — AB (ref 0–37)
Alkaline Phosphatase: 207 U/L — ABNORMAL HIGH (ref 39–117)
Anion gap: 8 (ref 5–15)
BILIRUBIN TOTAL: 0.6 mg/dL (ref 0.3–1.2)
BUN: 43 mg/dL — AB (ref 6–23)
CHLORIDE: 94 mmol/L — AB (ref 96–112)
CO2: 35 mmol/L — ABNORMAL HIGH (ref 19–32)
CREATININE: 0.78 mg/dL (ref 0.50–1.35)
Calcium: 9 mg/dL (ref 8.4–10.5)
GFR calc Af Amer: >90 mL/min (ref >90)
GFR, EST NON AFRICAN AMERICAN: 88 mL/min — AB (ref >90)
Glucose, Bld: 122 mg/dL — ABNORMAL HIGH (ref 70–99)
Potassium: 4.3 mmol/L (ref 3.5–5.1)
SODIUM: 137 mmol/L (ref 135–145)
Total Protein: 6.4 g/dL (ref 6.0–8.3)

## 2014-04-27 MED ORDER — MORPHINE SULFATE 2 MG/ML IJ SOLN: 0.5000 mg | INTRAMUSCULAR | Status: DC | PRN

## 2014-04-27 MED ORDER — ONDANSETRON HCL 4 MG/2ML IJ SOLN: 4.0000 mg | Freq: Four times a day (QID) | INTRAMUSCULAR | Status: DC | PRN

## 2014-04-27 MED ORDER — PNEUMOCOCCAL VAC POLYVALENT 25 MCG/0.5ML IJ INJ
0.5000 mL | INJECTION | INTRAMUSCULAR | Status: DC
  Filled 2014-04-27 (×2): qty 0.5

## 2014-04-27 MED ORDER — ALBUTEROL SULFATE (2.5 MG/3ML) 0.083% IN NEBU
2.5000 mg | INHALATION_SOLUTION | RESPIRATORY_TRACT | Status: DC | PRN
  Administered 2014-04-28: 2.5 mg via RESPIRATORY_TRACT
  Filled 2014-04-27: qty 3

## 2014-04-27 MED ORDER — ENSURE COMPLETE PO LIQD
237.0000 mL | Freq: Two times a day (BID) | ORAL | Status: DC
  Administered 2014-04-27: 237 mL via ORAL

## 2014-04-27 MED ORDER — DEXAMETHASONE SODIUM PHOSPHATE 4 MG/ML IJ SOLN
4.0000 mg | Freq: Four times a day (QID) | INTRAMUSCULAR | Status: DC
  Administered 2014-04-27 – 2014-05-03 (×23): 4 mg via INTRAVENOUS
  Filled 2014-04-27 (×28): qty 1

## 2014-04-27 MED ORDER — ENSURE COMPLETE PO LIQD
237.0000 mL | Freq: Three times a day (TID) | ORAL | Status: DC
  Administered 2014-04-27 – 2014-05-07 (×26): 237 mL via ORAL

## 2014-04-27 MED ORDER — IOHEXOL 300 MG/ML  SOLN
50.0000 mL | Freq: Once | INTRAMUSCULAR | Status: AC | PRN
  Administered 2014-04-27: 50 mL via ORAL

## 2014-04-27 MED ORDER — TIOTROPIUM BROMIDE MONOHYDRATE 18 MCG IN CAPS
18.0000 ug | ORAL_CAPSULE | Freq: Every day | RESPIRATORY_TRACT | Status: DC
  Administered 2014-04-27 – 2014-05-07 (×10): 18 ug via RESPIRATORY_TRACT
  Filled 2014-04-27 (×3): qty 5

## 2014-04-27 MED ORDER — ACETAMINOPHEN 325 MG PO TABS
650.0000 mg | ORAL_TABLET | Freq: Four times a day (QID) | ORAL | Status: DC | PRN
  Administered 2014-04-29 – 2014-05-02 (×2): 650 mg via ORAL
  Filled 2014-04-27 (×2): qty 2

## 2014-04-27 MED ORDER — IOHEXOL 300 MG/ML  SOLN
100.0000 mL | Freq: Once | INTRAMUSCULAR | Status: AC | PRN
  Administered 2014-04-27: 100 mL via INTRAVENOUS

## 2014-04-27 MED ORDER — BUDESONIDE-FORMOTEROL FUMARATE 160-4.5 MCG/ACT IN AERO
2.0000 | INHALATION_SPRAY | Freq: Two times a day (BID) | RESPIRATORY_TRACT | Status: DC
  Administered 2014-04-27 – 2014-05-07 (×22): 2 via RESPIRATORY_TRACT
  Filled 2014-04-27: qty 6

## 2014-04-27 MED ORDER — SODIUM CHLORIDE 0.9 % IV SOLN
INTRAVENOUS | Status: AC
  Administered 2014-04-27 (×2): via INTRAVENOUS

## 2014-04-27 MED ORDER — AMLODIPINE BESYLATE 5 MG PO TABS
5.0000 mg | ORAL_TABLET | Freq: Every day | ORAL | Status: DC
  Administered 2014-04-27 – 2014-05-07 (×10): 5 mg via ORAL
  Filled 2014-04-27 (×12): qty 1

## 2014-04-27 MED ORDER — ADULT MULTIVITAMIN W/MINERALS CH
1.0000 | ORAL_TABLET | Freq: Every day | ORAL | Status: DC
  Administered 2014-04-27 – 2014-05-07 (×10): 1 via ORAL
  Filled 2014-04-27 (×11): qty 1

## 2014-04-27 MED ORDER — CALCIUM CARBONATE-VITAMIN D 500-200 MG-UNIT PO TABS
1.0000 | ORAL_TABLET | Freq: Every day | ORAL | Status: DC
  Administered 2014-04-27 – 2014-05-07 (×10): 1 via ORAL
  Filled 2014-04-27 (×13): qty 1

## 2014-04-27 MED ORDER — CETYLPYRIDINIUM CHLORIDE 0.05 % MT LIQD
7.0000 mL | Freq: Two times a day (BID) | OROMUCOSAL | Status: DC
  Administered 2014-04-27 – 2014-04-29 (×3): 7 mL via OROMUCOSAL

## 2014-04-27 MED ORDER — ONDANSETRON HCL 4 MG PO TABS: 4.0000 mg | ORAL_TABLET | Freq: Four times a day (QID) | ORAL | Status: DC | PRN

## 2014-04-27 MED ORDER — NAPROXEN 250 MG PO TABS
250.0000 mg | ORAL_TABLET | Freq: Once | ORAL | Status: DC
  Filled 2014-04-27: qty 1

## 2014-04-27 MED ORDER — ALBUTEROL SULFATE HFA 108 (90 BASE) MCG/ACT IN AERS: 2.0000 | INHALATION_SPRAY | RESPIRATORY_TRACT | Status: DC | PRN

## 2014-04-27 MED ORDER — ENOXAPARIN SODIUM 40 MG/0.4ML ~~LOC~~ SOLN
40.0000 mg | SUBCUTANEOUS | Status: DC
  Administered 2014-04-27 – 2014-05-07 (×10): 40 mg via SUBCUTANEOUS
  Filled 2014-04-27 (×11): qty 0.4

## 2014-04-27 MED ORDER — DEXAMETHASONE SODIUM PHOSPHATE 4 MG/ML IJ SOLN
8.0000 mg | Freq: Once | INTRAMUSCULAR | Status: AC
  Administered 2014-04-27: 8 mg via INTRAVENOUS
  Filled 2014-04-27: qty 2

## 2014-04-27 MED ORDER — CHLORHEXIDINE GLUCONATE 0.12 % MT SOLN
15.0000 mL | Freq: Two times a day (BID) | OROMUCOSAL | Status: DC
  Administered 2014-04-27 – 2014-04-29 (×3): 15 mL via OROMUCOSAL
  Filled 2014-04-27 (×6): qty 15

## 2014-04-27 MED ORDER — ACETAMINOPHEN 650 MG RE SUPP: 650.0000 mg | Freq: Four times a day (QID) | RECTAL | Status: DC | PRN

## 2014-04-27 NOTE — Progress Notes (Signed)
Progress Note   Timothy Gonzalez GDJ:[id number] DOB: [birth year] DOA: 04/26/2014 PCP: Orpah Melter, MD   Brief Narrative:   Timothy Gonzalez is an 74 y.o. male with a PMH of COPD, and hypertension who has had progressive lower extremity weakness since 04/20/14. CT of the T-spine and L-spine showed a lung mass and a spinal mass invading T8. On-call neurosurgeon Dr. Ronnald Ramp was consulted and at this time Dr. Ronnald Ramp felt that patient was not a candidate for surgery.  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 Ronnald Ramp who did not feel that the patient was a candidate for surgical intervention.  Start Decadron.  Radiation oncology consultation requested.  F/U staging scans.  Unable to have MRI brain due to metal in leg.  IR consulted for CT guided biopsy.  Active Problems:   Severe protein calorie malnutrition  Dietician consult.  Ensure TID.    COPD (chronic obstructive pulmonary disease)  Currently stable.  Continue Albuterol PRN, Symbicort and Spiriva.      Hypertension  Continue Norvasc.    DVT Prophylaxis  Lovenox ordered.  Code Status: Full. Family Communication: Wife at bedside. Updated daughter Llana Aliment at [phone] by telephone.  Other daughter Sigmund Hazel [phone]. Disposition Plan: Home when diagnosis/treatment plan fully established.   IV Access:    Peripheral IV   Procedures and diagnostic studies:   Ct Thoracic/Lumbar Spine Wo Contrast 04/26/2014: 1. Spiculated right lung mass and destructive T8 spinal mass, most compatible with metastatic lung cancer. Spinal canal invasion at T8, almost certainly with cord compression in this setting. Thoracic MRI (without and with contrast preferred) would evaluate further if necessary. 2. Destructive soft tissue mass right sacral ala involving the right S1 nerve. 3. 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 4. Diffuse  spinal compression fractures sparing only the T1 and T3 levels in the thoracolumbar spine.   Dg Chest Port 1 View 04/26/2014: 1. Chronic but progressed tortuosity of the thoracic aorta. 2. Osteopenia with multilevel thoracic compression fractures. Possible left lateral fourth or fifth expansile rib lesion (arrow). 3. Chronic pulmonary hyperinflation, no definite acute pulmonary opacity.     Medical Consultants:    IR  Radiation Oncology  Anti-Infectives:    None.  Subjective:   Timothy Gonzalez is a bit dyspneic.  He tells me he has lost about 15 pounds over the past year, most of it recently because he stopped eating because it hurt him too much to try to walk to the bathroom.  No nausea or vomiting.  Objective:    Filed Vitals:   04/27/14 0000 04/27/14 0015 04/27/14 0105 04/27/14 0515  BP: 130/75 130/75 117/72 123/76  Pulse: 118 115 117 106  Temp:  98.1 F (36.7 C) 98.2 F (36.8 C) 98 F (36.7 C)  TempSrc:   Oral Oral  Resp: 25 27 22 18   Height:   6\' 3"  (1.905 m)   Weight:   56.5 kg (124 lb 9 oz)   SpO2: 99% 99% 98% 99%    Intake/Output Summary (Last 24 hours) at 04/27/14 0916 Last data filed at 04/27/14 0600  Gross per 24 hour  Intake 208.33 ml  Output    125 ml  Net  83.33 ml    Exam: Gen:  NAD, emaciated/cachectic Cardiovascular:  RRR, No M/R/G Respiratory:  Lungs diminished Gastrointestinal:  Abdomen soft, NT/ND, + BS Extremities:  No C/E/C   Data Reviewed:    Labs:  Basic Metabolic Panel:  Recent Labs Lab 04/26/14 1850 04/27/14 0130  NA 137 137  K 4.4 4.3  CL 95* 94*  CO2 37* 35*  GLUCOSE 105* 122*  BUN 37* 43*  CREATININE 0.77 0.78  CALCIUM 9.3 9.0   GFR Estimated Creatinine Clearance: 66.7 mL/min (by C-G formula based on Cr of 0.78). Liver Function Tests:  Recent Labs Lab 04/26/14 1850 04/27/14 0130  AST 84* 83*  ALT 37 35  ALKPHOS 221* 207*  BILITOT 0.8 0.6  PROT 6.4 6.4  ALBUMIN 2.7* 2.7*   CBC:  Recent Labs Lab  04/26/14 1850 04/27/14 0130  WBC 5.6 6.0  NEUTROABS 4.3 4.6  HGB 10.8* 10.1*  HCT 33.2* 30.4*  MCV 93.3 93.5  PLT 230 235    Microbiology No results found for this or any previous visit (from the past 240 hour(s)).   Medications:   . albuterol  5 mg Nebulization Once  . amLODipine  5 mg Oral Daily  . antiseptic oral rinse  7 mL Mouth Rinse q12n4p  . budesonide-formoterol  2 puff Inhalation BID  . calcium-vitamin D  1 tablet Oral Q breakfast  . chlorhexidine  15 mL Mouth Rinse BID  . enoxaparin (LOVENOX) injection  40 mg Subcutaneous Q24H  . feeding supplement (ENSURE COMPLETE)  237 mL Oral BID BM  .  morphine injection  1 mg Intravenous Once  . multivitamin with minerals  1 tablet Oral Daily  . naproxen  250 mg Oral Once  . [START ON 04/28/2014] pneumococcal 23 valent vaccine  0.5 mL Intramuscular Tomorrow-1000  . tiotropium  18 mcg Inhalation Daily   Continuous Infusions: . sodium chloride 50 mL/hr at 04/27/14 0150    Time spent: 35 minutes with > 50% of time discussing current diagnostic test results, clinical impression and plan of care.   LOS: 1 day   RAMA,CHRISTINA  [facility] Pager [phone]. If unable to reach me by pager, please call my cell phone at [phone].  *Please refer to [email or web address], password [email or web address] to get updated schedule on who will round on this patient, as hospitalists switch teams weekly. If 7PM-7AM, please contact night-coverage at [email or web address], password [email or web address] for any overnight needs.  04/27/2014, 9:16 AM

## 2014-04-27 NOTE — Progress Notes (Signed)
I have spoken with Dr. Rockne Menghini about this patient and have reviewed his chart.  Patient appears to have a cord compression at T8, with lack of sensation in legs for 2 weeks and inability to move legs for 1 week.  Decadron has been started with some foot movement since then.  He is not a surgical candidate and cannot have an MRI due to metal in his body. He appears to have a lung primary and widespread mets including bones and liver.  I will arrange for him to be brought downstairs to radiation oncology on Monday morning (04-29-14) in anticipation of radiation planning/delivery.  It would be ideal to have a tissue diagnosis of cancer by then, but given the totality of evidence leaning towards metastatic cancer, biopsy proof is not mandatory.     -----------------------------------  Eppie Gibson, MD Radiation Oncology

## 2014-04-27 NOTE — ED Notes (Addendum)
Pt calling out for "spiriva" and "albuterol".  MD made aware.  Albuterol treatment order.  MD and this RN at bedside, MD aware pt took two home inhalers from bedside before dose.

## 2014-04-27 NOTE — H&P (Signed)
[facility] History and Physical  Timothy Gonzalez XMI:[id number] DOB: 1940/10/14 DOA: 04/26/2014  Referring physician: ER physician. PCP: Orpah Melter, MD  Chief Complaint: Lower extremity weakness.  HPI: Timothy Gonzalez is a 74 y.o. male with history of COPD, hypertension started experiencing weakness of the lower extremity on February 6. Patient was unable to move his lower extremities and his weakness persisted came to the ER yesterday. CT of the T-spine and L-spine showed a lung mass and a spinal mass invading T8. On-call neurosurgeon Dr. Ronnald Ramp was consulted and at this time Dr. Ronnald Ramp felt that patient was not a candidate for surgery. Patient on exam is not able to move his lower extremities at all and also has some difficulty in controlling urine. Denies any weakness of upper extremity denies any chest pain or shortness of breath.   Review of Systems: As presented in the history of presenting illness, rest negative.  Past Medical History  Diagnosis Date  . COPD (chronic obstructive pulmonary disease)   . Celiac disease   . Asthma     childhood   Past Surgical History  Procedure Laterality Date  . Total hip arthroplasty  2005    right    Social History:  reports that he quit smoking about 3 years ago. His smoking use included Cigarettes. He has a 48 pack-year smoking history. He has never used smokeless tobacco. He reports that he drinks alcohol. He reports that he does not use illicit drugs. Where does patient live at home. Can patient participate in ADLs? No.  Allergies  Allergen Reactions  . Barley Grass   . Wheat     Family History: History reviewed. No pertinent family history.    Prior to Admission medications   Medication Sig Start Date End Date Taking? Authorizing Provider  albuterol (PROVENTIL) (2.5 MG/3ML) 0.083% nebulizer solution Take 2.5 mg by nebulization every 4 (four) hours as needed.     Yes Historical Provider, MD  alendronate (FOSAMAX) 70  MG tablet Take 70 mg by mouth once a week. Sunday Once weekly 10/20/10  Yes Historical Provider, MD  amLODipine (NORVASC) 5 MG tablet Take 5 mg by mouth daily.     Yes Historical Provider, MD  budesonide-formoterol (SYMBICORT) 160-4.5 MCG/ACT inhaler Inhale 2 puffs into the lungs 2 (two) times daily. 08/23/11  Yes Collene Gobble, MD  Calcium Carbonate-Vitamin D (CALTRATE 600+D) 600-400 MG-UNIT per tablet Take 1 tablet by mouth daily.    Yes Historical Provider, MD  Chelated Magnesium 100 MG TABS Take 1 tablet by mouth daily. 400 mg   Yes Historical Provider, MD  Multiple Vitamin (MULTIVITAMIN) tablet Take 1 tablet by mouth daily.     Yes Historical Provider, MD  naproxen sodium (ANAPROX) 220 MG tablet Take 220 mg by mouth 2 (two) times daily with a meal.   Yes Historical Provider, MD  PROAIR HFA 108 (90 BASE) MCG/ACT inhaler INHALE 1 TO 2 PUFFS EVERY 6 HOURS AS NEEDED 01/02/14  Yes Collene Gobble, MD  tiotropium (SPIRIVA HANDIHALER) 18 MCG inhalation capsule PLACE 1 CAPSULE (18 MCG TOTAL) INTO INHALER AND INHALE DAILY. 10/31/13  Yes Collene Gobble, MD  vitamin B-12 (CYANOCOBALAMIN) 1000 MCG tablet Take 1,000 mcg by mouth daily.    Yes Historical Provider, MD    Physical Exam: Filed Vitals:   04/26/14 2230 04/26/14 2315 04/26/14 2345 04/27/14 0000  BP: 118/74 117/74 113/69 130/75  Pulse: 109 117 114 118  Temp:      TempSrc:  Resp: 21 25 21 25   SpO2: 100% 100% 99% 99%     General:  Moderately built and nourished.  Eyes: Anicteric no pallor.  ENT: No discharge from the ears eyes nose normal.  Neck: No mass felt.  Cardiovascular: S1-S2 heard.  Respiratory: No rhonchi or crepitations.  Abdomen: Soft nontender bowel sounds present.  Skin: No rash.  Musculoskeletal: Bilateral lower Schmidt edema.  Psychiatric: Appears normal.  Neurologic: Alert and oriented to time place and person. Paraplegia.  Labs on Admission:  Basic Metabolic Panel:  Recent Labs Lab 04/26/14 1850  NA  137  K 4.4  CL 95*  CO2 37*  GLUCOSE 105*  BUN 37*  CREATININE 0.77  CALCIUM 9.3   Liver Function Tests:  Recent Labs Lab 04/26/14 1850  AST 84*  ALT 37  ALKPHOS 221*  BILITOT 0.8  PROT 6.4  ALBUMIN 2.7*   No results for input(s): LIPASE, AMYLASE in the last 168 hours. No results for input(s): AMMONIA in the last 168 hours. CBC:  Recent Labs Lab 04/26/14 1850  WBC 5.6  NEUTROABS 4.3  HGB 10.8*  HCT 33.2*  MCV 93.3  PLT 230   Cardiac Enzymes: No results for input(s): CKTOTAL, CKMB, CKMBINDEX, TROPONINI in the last 168 hours.  BNP (last 3 results) No results for input(s): BNP in the last 8760 hours.  ProBNP (last 3 results) No results for input(s): PROBNP in the last 8760 hours.  CBG: No results for input(s): GLUCAP in the last 168 hours.  Radiological Exams on Admission: Ct Thoracic Spine Wo Contrast  04/26/2014   CLINICAL DATA:  74 year old who fell out of wheelchair 6 days ago now unable to move bilateral lower extremities and numbness from the waist down. Initial encounter.  EXAM: CT THORACIC AND LUMBAR SPINE WITHOUT CONTRAST  TECHNIQUE: Multidetector CT imaging of the thoracic and lumbar spine was performed without contrast. Multiplanar CT image reconstructions were also generated.  COMPARISON:  Chest radiographs 1928 hr the same day. Chest CTA 11/10/2005.  FINDINGS: CT THORACIC SPINE FINDINGS  Spiculated right upper lobe lung mass measuring 31 mm longus dimension. Severe underlying emphysema. No definite mediastinal or hilar lymphadenopathy.  Diffuse osteopenia with thoracolumbar compression fractures sparing only the T1, and T3 levels.  At the T8 level there is a destructive soft tissue mass which has eroded most of the T8 posterior elements, the right T8 pedicle, and has very likely invaded the thoracic spinal canal. Suspect thoracic spinal cord compression at this level in this setting.  No other destructive osseous lesion identified in the thoracic spine.  CT  LUMBAR SPINE FINDINGS  Osteopenia and diffuse lumbar compression fractures.  Superimposed destructive mass of the right sacral ala which has eroded into the right sacral epidural space and likely affects the exiting right S1 nerve root.  Aortoiliac calcified atherosclerosis noted. There is a left paraspinal muscle mass at the L2-L3 level measuring about 34 mm diameter. Limited evaluation of the abdominal and pelvic viscera, with no acute visceral abnormality is evident.  IMPRESSION: 1. Spiculated right lung mass and destructive T8 spinal mass, most compatible with metastatic lung cancer. Spinal canal invasion at T8, almost certainly with cord compression in this setting. Thoracic MRI (without and with contrast preferred) would evaluate further if necessary. 2. Destructive soft tissue mass right sacral ala involving the right S1 nerve. 3. 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 4. Diffuse spinal compression fractures sparing only the T1 and T3 levels in the thoracolumbar spine. Critical Value/emergent results  were called by telephone at the time of interpretation on 04/26/2014 at 9:21 pm to Dr. Quintella Reichert , who verbally acknowledged these results.   Electronically Signed   By: Genevie Ann M.D.   On: 04/26/2014 21:22   Ct Lumbar Spine Wo Contrast  04/26/2014   CLINICAL DATA:  74 year old who fell out of wheelchair 6 days ago now unable to move bilateral lower extremities and numbness from the waist down. Initial encounter.  EXAM: CT THORACIC AND LUMBAR SPINE WITHOUT CONTRAST  TECHNIQUE: Multidetector CT imaging of the thoracic and lumbar spine was performed without contrast. Multiplanar CT image reconstructions were also generated.  COMPARISON:  Chest radiographs 1928 hr the same day. Chest CTA 11/10/2005.  FINDINGS: CT THORACIC SPINE FINDINGS  Spiculated right upper lobe lung mass measuring 31 mm longus dimension. Severe underlying emphysema. No definite mediastinal or hilar lymphadenopathy.  Diffuse  osteopenia with thoracolumbar compression fractures sparing only the T1, and T3 levels.  At the T8 level there is a destructive soft tissue mass which has eroded most of the T8 posterior elements, the right T8 pedicle, and has very likely invaded the thoracic spinal canal. Suspect thoracic spinal cord compression at this level in this setting.  No other destructive osseous lesion identified in the thoracic spine.  CT LUMBAR SPINE FINDINGS  Osteopenia and diffuse lumbar compression fractures.  Superimposed destructive mass of the right sacral ala which has eroded into the right sacral epidural space and likely affects the exiting right S1 nerve root.  Aortoiliac calcified atherosclerosis noted. There is a left paraspinal muscle mass at the L2-L3 level measuring about 34 mm diameter. Limited evaluation of the abdominal and pelvic viscera, with no acute visceral abnormality is evident.  IMPRESSION: 1. Spiculated right lung mass and destructive T8 spinal mass, most compatible with metastatic lung cancer. Spinal canal invasion at T8, almost certainly with cord compression in this setting. Thoracic MRI (without and with contrast preferred) would evaluate further if necessary. 2. Destructive soft tissue mass right sacral ala involving the right S1 nerve. 3. 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 4. Diffuse spinal compression fractures sparing only the T1 and T3 levels in the thoracolumbar spine. Critical Value/emergent results were called by telephone at the time of interpretation on 04/26/2014 at 9:21 pm to Dr. Quintella Reichert , who verbally acknowledged these results.   Electronically Signed   By: Genevie Ann M.D.   On: 04/26/2014 21:22   Dg Chest Port 1 View  04/26/2014   CLINICAL DATA:  74 year old male with numbness from the waist down. Wheelchair bound. Initial encounter.  EXAM: PORTABLE CHEST - 1 VIEW  COMPARISON:  06/18/2010.  FINDINGS: Three portable views of the chest. Chronic but progressed  tortuosity of the thoracic aorta. Cardiac size and other mediastinal contours remain within normal limits. Chronic pulmonary hyperinflation. Multilevel thoracic compression fractures. Osteopenia.  In the peripheral left upper chest there is a rounded extrapleural type density which may be associated with the lateral fourth or fifth ribs, uncertain.  Otherwise allowing for portable technique the lungs are clear.  IMPRESSION: 1. Chronic but progressed tortuosity of the thoracic aorta. 2. Osteopenia with multilevel thoracic compression fractures. Possible left lateral fourth or fifth expansile rib lesion (arrow). 3. Chronic pulmonary hyperinflation, no definite acute pulmonary opacity.   Electronically Signed   By: Genevie Ann M.D.   On: 04/26/2014 20:24    EKG: Independently reviewed. Sinus tachycardia with RBBB.  Assessment/Plan Principal Problem:   Paraplegia Active Problems:  COPD (chronic obstructive pulmonary disease)   Spinal cord mass   Lung mass   Hypertension   1. 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 I have discussed the on-call neurosurgeon and since patient is not a candidate for surgery and may benefit from radiation patient has been transferred to [facility]. Patient is agreeable to transfer. Consult oncology in a.m. I have ordered CT chest abdomen and pelvis and MRI brain. I did discuss with pulmonary critical care and they have requested to consult interventional radiology for biopsy. 2. COPD - presently not wheezing. 3. Hypertension - continue present medications. 4. Chronic anemia - follow CBC.   DVT Prophylaxis Lovenox.  Code Status: Full code.  Family Communication: Patient's daughter at the bedside.  Disposition Plan: Admit to inpatient.    Klaudia Beirne N. [facility] Pager [phone].  If 7PM-7AM, please contact night-coverage [email or web address] Password [email or web address] 04/27/2014, 12:13 AM

## 2014-04-28 LAB — URINE CULTURE
CULTURE: NO GROWTH
Colony Count: NO GROWTH

## 2014-04-28 NOTE — Progress Notes (Signed)
Patient ID: Timothy Gonzalez, male   DOB: [birth year], 74 y.o.   MRN: [id number] Request received for CT biopsy on pt. Hx noted. Will plan to review latest imaging studies with Dr. Annamaria Boots in am to determine appropriate site for bx . Preprocedure orders placed. Pt aware of plans.

## 2014-04-28 NOTE — Progress Notes (Signed)
PT Cancellation Note  Patient Details Name: Timothy Gonzalez MRN: [id number] DOB: Feb 23, 1941   Cancelled Treatment:    Reason Eval/Treat Not Completed: Medical issues which prohibited therapy (pt reports that he is having difficulty with gas  after eating. Observed in bed, guarding /holding onto rails with both arms extended.. Noted SOB, on 4 l. RN in to assess. PT will check back 2/15 for  eval as patient able to tolerate.)   Claretha Cooper 04/28/2014, 2:09 PM Tresa Endo PT [phone]

## 2014-04-28 NOTE — Progress Notes (Signed)
Progress Note   Timothy Gonzalez YTK:[id number] DOB: 11-Sep-1940 DOA: 04/26/2014 PCP: Orpah Melter, MD   Brief Narrative:   Timothy Gonzalez is an 74 y.o. male with a PMH of COPD, and hypertension who has had progressive lower extremity weakness since 04/20/14. CT of the T-spine and L-spine showed a lung mass and a spinal mass invading T8. On-call neurosurgeon Dr. Ronnald Ramp was consulted and at this time Dr. Ronnald Ramp felt that patient was not a candidate for surgery.  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 Ronnald Ramp who did not feel that the patient was a candidate for surgical intervention.  Continue Decadron.  Radiation oncology consultation requested.  Spoke with Dr. Isidore Moos who plans to initiate XRT on 04/30/14.  F/U staging scans.  Unable to have MRI brain due to metal in leg.  IR consulted for CT guided biopsy, which will be done 04/27/14.  Active Problems:   Severe protein calorie malnutrition  Dietician consulted.  Ensure TID.    COPD (chronic obstructive pulmonary disease)  Currently stable.  Continue Albuterol PRN, Symbicort and Spiriva.      Hypertension  Continue Norvasc.    DVT Prophylaxis  Lovenox ordered.  Code Status: Full. Family Communication: Wife at bedside 04/27/14. Updated daughter Llana Aliment at [phone] by telephone.  Other daughter Sigmund Hazel [phone]. Disposition Plan: Home versus SNF when diagnosis/treatment plan fully established.   IV Access:    Peripheral IV   Procedures and diagnostic studies:   Ct Thoracic/Lumbar Spine Wo Contrast 04/26/2014: 1. Spiculated right lung mass and destructive T8 spinal mass, most compatible with metastatic lung cancer. Spinal canal invasion at T8, almost certainly with cord compression in this setting. Thoracic MRI (without and with contrast preferred) would evaluate further if necessary. 2. Destructive soft tissue mass right  sacral ala involving the right S1 nerve. 3. 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 4. Diffuse spinal compression fractures sparing only the T1 and T3 levels in the thoracolumbar spine.   Dg Chest Port 1 View 04/26/2014: 1. Chronic but progressed tortuosity of the thoracic aorta. 2. Osteopenia with multilevel thoracic compression fractures. Possible left lateral fourth or fifth expansile rib lesion (arrow). 3. Chronic pulmonary hyperinflation, no definite acute pulmonary opacity.     Medical Consultants:    IR  Radiation Oncology  Anti-Infectives:    None.  Subjective:   Timothy Gonzalez is feeling well.  Denies pain, dyspnea, nausea or vomiting.  Last BM was 2 days ago.  Still unable to significantly move lower extremities.  Objective:    Filed Vitals:   04/27/14 1040 04/27/14 1400 04/27/14 2212 04/28/14 0606  BP: 106/64 115/66 122/68 116/72  Pulse:  107 102 88  Temp:  98 F (36.7 C) 98 F (36.7 C) 98.8 F (37.1 C)  TempSrc:  Oral Oral Oral  Resp:  24 22 18   Height:      Weight:      SpO2:  100% 100% 100%    Intake/Output Summary (Last 24 hours) at 04/28/14 0804 Last data filed at 04/28/14 0500  Gross per 24 hour  Intake    600 ml  Output   1650 ml  Net  -1050 ml    Exam: Gen:  NAD, emaciated/cachectic Cardiovascular:  RRR, No M/R/G Respiratory:  Lungs diminished Gastrointestinal:  Abdomen soft, NT/ND, + BS Extremities:  No C/E/C Neuro: Very little voluntary movement of feet.  Reflexes intact.   Data  Reviewed:    Labs: Basic Metabolic Panel:  Recent Labs Lab 04/26/14 1850 04/27/14 0130  NA 137 137  K 4.4 4.3  CL 95* 94*  CO2 37* 35*  GLUCOSE 105* 122*  BUN 37* 43*  CREATININE 0.77 0.78  CALCIUM 9.3 9.0   GFR Estimated Creatinine Clearance: 66.7 mL/min (by C-G formula based on Cr of 0.78). Liver Function Tests:  Recent Labs Lab 04/26/14 1850 04/27/14 0130  AST 84* 83*  ALT 37 35  ALKPHOS 221* 207*  BILITOT 0.8 0.6   PROT 6.4 6.4  ALBUMIN 2.7* 2.7*   CBC:  Recent Labs Lab 04/26/14 1850 04/27/14 0130  WBC 5.6 6.0  NEUTROABS 4.3 4.6  HGB 10.8* 10.1*  HCT 33.2* 30.4*  MCV 93.3 93.5  PLT 230 235    Microbiology No results found for this or any previous visit (from the past 240 hour(s)).   Medications:   . albuterol  5 mg Nebulization Once  . amLODipine  5 mg Oral Daily  . antiseptic oral rinse  7 mL Mouth Rinse q12n4p  . budesonide-formoterol  2 puff Inhalation BID  . calcium-vitamin D  1 tablet Oral Q breakfast  . chlorhexidine  15 mL Mouth Rinse BID  . dexamethasone  4 mg Intravenous 4 times per day  . enoxaparin (LOVENOX) injection  40 mg Subcutaneous Q24H  . feeding supplement (ENSURE COMPLETE)  237 mL Oral TID WC  .  morphine injection  1 mg Intravenous Once  . multivitamin with minerals  1 tablet Oral Daily  . naproxen  250 mg Oral Once  . pneumococcal 23 valent vaccine  0.5 mL Intramuscular Tomorrow-1000  . tiotropium  18 mcg Inhalation Daily   Continuous Infusions:    Time spent: 35 minutes.  The patient is medically complex and requires high complexity decision making and coordination of care.   LOS: 2 days   Letricia Krinsky  [facility] Pager [phone]. If unable to reach me by pager, please call my cell phone at [phone].  *Please refer to [email or web address], password [email or web address] to get updated schedule on who will round on this patient, as hospitalists switch teams weekly. If 7PM-7AM, please contact night-coverage at [email or web address], password [email or web address] for any overnight needs.  04/28/2014, 8:04 AM

## 2014-04-28 NOTE — Progress Notes (Signed)
INITIAL NUTRITION ASSESSMENT  DOCUMENTATION CODES Per approved criteria  -Severe malnutrition in the context of chronic illness -underweight  Pt meets criteria for severe MALNUTRITION in the context of chronic illness as evidenced by severe muscle and fat depletion and 14% weight loss x 2 months.  INTERVENTION: Continue Ensure Complete po TID, each supplement provides 350 kcal and 13 grams of protein Encourage PO intake  NUTRITION DIAGNOSIS: Underweight related to metastatic lung cancer as evidenced by BMI of 15.6.   Goal: Pt to meet >/= 90% of their estimated nutrition needs   Monitor:  PO and supplemental intake, weight, labs, I/O's  Reason for Assessment: Consult for nutritional assessment  Admitting Dx: Paraplegia  ASSESSMENT: 74 y.o. male with history of COPD, hypertension started experiencing weakness of the lower extremity on February 6. Patient was unable to move his lower extremities and his weakness persisted came to the ER yesterday. CT of the T-spine and L-spine showed a lung mass and a spinal mass invading T8.   Pt with celiac disease, follows a gluten free diet at home. Pt with no questions at this time about diet. Pt reports feeling very hungry, "like a starving dog".  Pt reports not wanting to eat PTA, d/t feeling scared he was going to fall trying to go to the bathroom.   Pt reports drinking Ensure supplements, states he really likes the strawberry flavor.   Per weight history, pt has lost 21 lb since 2/01 (14% weight loss x 2 weeks, significant for time frame).  Nutrition Focused Physical Exam:  Subcutaneous Fat:  Orbital Region: WNL Upper Arm Region: severe depletion Thoracic and Lumbar Region: NA  Muscle:  Temple Region: severe depletion Clavicle Bone Region:severe depletion Clavicle and Acromion Bone Region: severe depletion Scapular Bone Region: NA Dorsal Hand: severe depletion Patellar Region: NA Anterior Thigh Region: NA Posterior Calf Region:  NA  Edema: non-pitting RLE and LLE edema  Labs reviewed: Elevated BUN  Height: Ht Readings from Last 1 Encounters:  04/27/14 6\' 3"  (1.905 m)    Weight: Wt Readings from Last 1 Encounters:  04/27/14 124 lb 9 oz (56.5 kg)    Ideal Body Weight: 196 lb  % Ideal Body Weight: 63%  Wt Readings from Last 10 Encounters:  04/27/14 124 lb 9 oz (56.5 kg)  02/12/14 145 lb (65.772 kg)  08/09/13 145 lb (65.772 kg)  01/23/13 144 lb 9.6 oz (65.59 kg)  07/10/12 148 lb (67.132 kg)  01/11/12 139 lb 12.8 oz (63.413 kg)  07/15/11 144 lb (65.318 kg)  03/01/11 149 lb (67.586 kg)  10/23/10 141 lb 3.2 oz (64.048 kg)  07/23/10 139 lb 12.8 oz (63.413 kg)    Usual Body Weight: 140-145 lb -per pt  % Usual Body Weight: 89%  BMI:  Body mass index is 15.57 kg/(m^2).  Estimated Nutritional Needs: Kcal: 1700-1900 Protein: 80-90g Fluid: 1.7L/day  Skin: intact, ecchymosis  Diet Order: Diet regular Diet NPO time specified Except for: Sips with Meds  EDUCATION NEEDS: -No education needs identified at this time   Intake/Output Summary (Last 24 hours) at 04/28/14 1202 Last data filed at 04/28/14 0500  Gross per 24 hour  Intake    400 ml  Output   1400 ml  Net  -1000 ml    Last BM: 2/12  Labs:   Recent Labs Lab 04/26/14 1850 04/27/14 0130  NA 137 137  K 4.4 4.3  CL 95* 94*  CO2 37* 35*  BUN 37* 43*  CREATININE 0.77 0.78  CALCIUM  9.3 9.0  GLUCOSE 105* 122*    CBG (last 3)  No results for input(s): GLUCAP in the last 72 hours.  Scheduled Meds: . albuterol  5 mg Nebulization Once  . amLODipine  5 mg Oral Daily  . antiseptic oral rinse  7 mL Mouth Rinse q12n4p  . budesonide-formoterol  2 puff Inhalation BID  . calcium-vitamin D  1 tablet Oral Q breakfast  . chlorhexidine  15 mL Mouth Rinse BID  . dexamethasone  4 mg Intravenous 4 times per day  . enoxaparin (LOVENOX) injection  40 mg Subcutaneous Q24H  . feeding supplement (ENSURE COMPLETE)  237 mL Oral TID WC  .   morphine injection  1 mg Intravenous Once  . multivitamin with minerals  1 tablet Oral Daily  . naproxen  250 mg Oral Once  . pneumococcal 23 valent vaccine  0.5 mL Intramuscular Tomorrow-1000  . tiotropium  18 mcg Inhalation Daily    Continuous Infusions:   Past Medical History  Diagnosis Date  . COPD (chronic obstructive pulmonary disease)   . Celiac disease   . Asthma     childhood    Past Surgical History  Procedure Laterality Date  . Total hip arthroplasty  2005    right     Clayton Bibles, MS, RD, LDN Pager: [phone] After Hours Pager: [phone]

## 2014-04-29 ENCOUNTER — Inpatient Hospital Stay (HOSPITAL_COMMUNITY): Payer: Medicare Other

## 2014-04-29 ENCOUNTER — Ambulatory Visit
Admit: 2014-04-29 | Discharge: 2014-04-29 | Disposition: A | Discharge status: Home / Self Care | Payer: Medicare Other | Attending: Radiation Oncology | Admitting: Radiation Oncology

## 2014-04-29 ENCOUNTER — Encounter: Payer: Self-pay | Admitting: Radiation Oncology

## 2014-04-29 DIAGNOSIS — Z510 Encounter for antineoplastic radiation therapy: Secondary | ICD-10-CM | POA: Insufficient documentation

## 2014-04-29 DIAGNOSIS — C7951 Secondary malignant neoplasm of bone: Secondary | ICD-10-CM

## 2014-04-29 DIAGNOSIS — Z86718 Personal history of other venous thrombosis and embolism: Secondary | ICD-10-CM | POA: Insufficient documentation

## 2014-04-29 DIAGNOSIS — R222 Localized swelling, mass and lump, trunk: Secondary | ICD-10-CM | POA: Insufficient documentation

## 2014-04-29 DIAGNOSIS — I10 Essential (primary) hypertension: Secondary | ICD-10-CM | POA: Insufficient documentation

## 2014-04-29 DIAGNOSIS — R636 Underweight: Secondary | ICD-10-CM | POA: Diagnosis present

## 2014-04-29 DIAGNOSIS — J449 Chronic obstructive pulmonary disease, unspecified: Secondary | ICD-10-CM | POA: Insufficient documentation

## 2014-04-29 DIAGNOSIS — Z7901 Long term (current) use of anticoagulants: Secondary | ICD-10-CM | POA: Insufficient documentation

## 2014-04-29 DIAGNOSIS — Z7951 Long term (current) use of inhaled steroids: Secondary | ICD-10-CM | POA: Insufficient documentation

## 2014-04-29 DIAGNOSIS — C7952 Secondary malignant neoplasm of bone marrow: Principal | ICD-10-CM

## 2014-04-29 DIAGNOSIS — C3490 Malignant neoplasm of unspecified part of unspecified bronchus or lung: Secondary | ICD-10-CM | POA: Insufficient documentation

## 2014-04-29 DIAGNOSIS — G8220 Paraplegia, unspecified: Secondary | ICD-10-CM | POA: Insufficient documentation

## 2014-04-29 DIAGNOSIS — C779 Secondary and unspecified malignant neoplasm of lymph node, unspecified: Secondary | ICD-10-CM | POA: Insufficient documentation

## 2014-04-29 DIAGNOSIS — C787 Secondary malignant neoplasm of liver and intrahepatic bile duct: Secondary | ICD-10-CM | POA: Insufficient documentation

## 2014-04-29 LAB — CBC
HEMATOCRIT: 28.3 % — AB (ref 39.0–52.0)
HEMOGLOBIN: 9.1 g/dL — AB (ref 13.0–17.0)
MCH: 30.4 pg (ref 26.0–34.0)
MCHC: 32.2 g/dL (ref 30.0–36.0)
MCV: 94.6 fL (ref 78.0–100.0)
Platelets: 227 10*3/uL (ref 150–400)
RBC: 2.99 MIL/uL — ABNORMAL LOW (ref 4.22–5.81)
RDW: 15.8 % — ABNORMAL HIGH (ref 11.5–15.5)
WBC: 8.5 10*3/uL (ref 4.0–10.5)

## 2014-04-29 LAB — BASIC METABOLIC PANEL
Anion gap: 3 — ABNORMAL LOW (ref 5–15)
BUN: 33 mg/dL — AB (ref 6–23)
CO2: 35 mmol/L — ABNORMAL HIGH (ref 19–32)
CREATININE: 0.52 mg/dL (ref 0.50–1.35)
Calcium: 9 mg/dL (ref 8.4–10.5)
Chloride: 97 mmol/L (ref 96–112)
GFR calc Af Amer: >90 mL/min (ref >90)
Glucose, Bld: 124 mg/dL — ABNORMAL HIGH (ref 70–99)
POTASSIUM: 5 mmol/L (ref 3.5–5.1)
SODIUM: 135 mmol/L (ref 135–145)

## 2014-04-29 LAB — APTT: aPTT: 31 seconds (ref 24–37)

## 2014-04-29 LAB — PROTIME-INR
INR: 1.15 (ref 0.00–1.49)
Prothrombin Time: 14.9 seconds (ref 11.6–15.2)

## 2014-04-29 MED ORDER — SODIUM CHLORIDE 0.9 % IV SOLN
INTRAVENOUS | Status: DC
  Administered 2014-04-29 – 2014-04-30 (×3): via INTRAVENOUS

## 2014-04-29 MED ORDER — LORAZEPAM 2 MG/ML IJ SOLN
0.5000 mg | INTRAMUSCULAR | Status: DC | PRN
Start: 2014-04-29 — End: 2014-05-02
  Administered 2014-05-01: 0.5 mg via INTRAVENOUS
  Filled 2014-04-29: qty 1

## 2014-04-29 NOTE — Progress Notes (Signed)
  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  Identity was confirmed.  All relevant records and images related to the planned course of therapy were reviewed.  The patient freely provided informed written consent to proceed with treatment after reviewing the details related to the planned course of therapy. The consent form was witnessed and verified by the simulation staff.    Then, the patient was set-up in a stable reproducible  supine position for radiation therapy.  CT images were obtained.  Surface markings were placed.  The CT images were loaded into the planning software.    TREATMENT PLANNING NOTE: Treatment planning then occurred.  The radiation prescription was entered and confirmed.    A total of 2 medically necessary complex treatment devices were fabricated and supervised by me  - wedged pair using MLCs. I have requested : 3D Simulation  I have requested a DVH of the following structures: esophagus, cord, lungs, CTV.    The patient will receive 30 Gy in 10 fractions from T6-T10.   -----------------------------------  Eppie Gibson, MD

## 2014-04-29 NOTE — Consult Note (Signed)
Reason for consult: left paraspinal ST mass biopsy  Referring Physician(s): [facility]  History of Present Illness: Timothy Gonzalez is a 74 y.o. male, long term smoker, with history of COPD, hypertension who started experiencing weakness of the lower extremites on February 6. He was unable to move his lower extremities and his weakness persisted so he came to the ER 2/12 . CT revealed spiculated 2.8 cm pulmonary nodule in posterior right upper lobe, consistent with primary bronchogenic carcinoma, mild right hilar lymphadenopathy, consistent with metastatic disease, diffuse bone metastases, with spinal cord compression demonstrated at level of T8, diffuse liver metastases, left posterior abdominal wall soft tissue metastasis (L3-4 region),mild lymphadenopathy in the porta hepatis and right external iliac chains, consistent with metastatic disease. On-call neurosurgeon Dr. Ronnald Ramp was consulted and at this time Dr. Ronnald Ramp felt that the patient was not a candidate for surgery. Radiation oncology has seen pt and he is also scheduled for first treatment today. Request now received for image guided biopsy for definitive diagnosis.    Past Medical History  Diagnosis Date  . COPD (chronic obstructive pulmonary disease)   . Celiac disease   . Asthma     childhood    Past Surgical History  Procedure Laterality Date  . Total hip arthroplasty  2005    right     Allergies: Barley grass and Wheat  Medications: Prior to Admission medications   Medication Sig Start Date End Date Taking? Authorizing Provider  albuterol (PROVENTIL) (2.5 MG/3ML) 0.083% nebulizer solution Take 2.5 mg by nebulization every 4 (four) hours as needed.     Yes Historical Provider, MD  alendronate (FOSAMAX) 70 MG tablet Take 70 mg by mouth once a week. Sunday Once weekly 10/20/10  Yes Historical Provider, MD  amLODipine (NORVASC) 5 MG tablet Take 5 mg by mouth daily.     Yes Historical Provider, MD  budesonide-formoterol  (SYMBICORT) 160-4.5 MCG/ACT inhaler Inhale 2 puffs into the lungs 2 (two) times daily. 08/23/11  Yes Collene Gobble, MD  Calcium Carbonate-Vitamin D (CALTRATE 600+D) 600-400 MG-UNIT per tablet Take 1 tablet by mouth daily.    Yes Historical Provider, MD  Chelated Magnesium 100 MG TABS Take 1 tablet by mouth daily. 400 mg   Yes Historical Provider, MD  Multiple Vitamin (MULTIVITAMIN) tablet Take 1 tablet by mouth daily.     Yes Historical Provider, MD  naproxen sodium (ANAPROX) 220 MG tablet Take 220 mg by mouth 2 (two) times daily with a meal.   Yes Historical Provider, MD  PROAIR HFA 108 (90 BASE) MCG/ACT inhaler INHALE 1 TO 2 PUFFS EVERY 6 HOURS AS NEEDED 01/02/14  Yes Collene Gobble, MD  tiotropium (SPIRIVA HANDIHALER) 18 MCG inhalation capsule PLACE 1 CAPSULE (18 MCG TOTAL) INTO INHALER AND INHALE DAILY. 10/31/13  Yes Collene Gobble, MD  vitamin B-12 (CYANOCOBALAMIN) 1000 MCG tablet Take 1,000 mcg by mouth daily.    Yes Historical Provider, MD    History reviewed. No pertinent family history.  History   Social History  . Marital Status: Married    Spouse Name: N/A  . Number of Children: 4  . Years of Education: N/A   Occupational History  . 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 Smoking status: Former Smoker -- 1.00 packs/day for 48 years    Types: Cigarettes    Quit date: 06/14/2010  . Smokeless tobacco: Never Used  . Alcohol Use: Yes     Comment: rare  .  Drug Use: No  . Sexual Activity: Not on file   Other Topics Concern  . None   Social History Narrative      Review of Systems pt currently denies pain,N/V or sig resp difficulties; constipated; cannot move LE's but sens intact  Vital Signs: BP 127/77 mmHg  Pulse 91  Temp(Src) 97.6 F (36.4 C) (Oral)  Resp 18  Ht 6\' 3"  (1.905 m)  Wt 124 lb 9 oz (56.5 kg)  BMI 15.57 kg/m2  SpO2 99%  Physical Exam pt awake/alert; cachetic appearing; chest- distant BS bilat; heart- RRR; abd- soft, +BS,NT;  ext- warm, sens fxn/pulses intact; unable to sig move LE's  Imaging: Ct Chest W Contrast  04/27/2014   CLINICAL DATA:  Bone metastases.  Right lung mass.  COPD.  EXAM: CT CHEST, ABDOMEN, AND PELVIS WITH CONTRAST  TECHNIQUE: Multidetector CT imaging of the chest, abdomen and pelvis was performed following the standard protocol during bolus administration of intravenous contrast.  CONTRAST:  131mL OMNIPAQUE IOHEXOL 300 MG/ML  SOLN  COMPARISON:  Chest CT only on 11/10/2005  FINDINGS: CT CHEST FINDINGS  Mediastinum/Lymph Nodes: No mediastinal lymphadenopathy identified. Mild right hilar lymphadenopathy is seen measuring 13 mm on image 33/series 2.  Lungs/Pleura: Superior pulmonary emphysema. A spiculated mass is seen in the posterior right upper lobe measuring 2.8 x 2.1 cm, consistent with primary bronchogenic carcinoma. No other suspicious masses are seen. 8 mm nodule in the peripheral right lower lobe is stable, consistent with benign etiology. No evidence of pleural effusion.  Musculoskeletal/Soft Tissues: Multiple lytic bone metastases are seen within the thoracic spine and ribs, with spinal cord compression noted at the level of T8, as demonstrated on recent spine CT.  CT ABDOMEN AND PELVIS FINDINGS  Hepatobiliary: Diffuse liver metastases are seen throughout the right and left hepatic lobes. Gallbladder is unremarkable. No evidence of biliary ductal dilatation.  Pancreas: No mass, inflammatory changes, or other parenchymal abnormality identified.  Spleen:  Within normal limits in size and appearance.  Adrenal Glands:  No mass identified.  Kidneys/Urinary Tract: No masses identified. No evidence of hydronephrosis. Foley catheter seen in bladder, which is decompressed.  Stomach/Bowel/Peritoneum: No evidence of wall thickening, mass, or obstruction.  Vascular/Lymphatic: Mild upper abdominal lymphadenopathy is seen within the porta hepatis. Mild lymphadenopathy is also seen in the pelvis in the right external  iliac chain, with largest lymph node measuring 1.8 cm on image 96. Artifact from right hip prosthesis obscures visualization in the lower pelvis.  Reproductive:  No mass or other significant abnormality identified.  Other: Left posterior abdominal wall soft tissue mass is seen in the left paraspinal muscles at the level of L4 measuring 4.6 x 2.8 cm on image 68. This is consistent with abdominal wall metastasis.  Musculoskeletal: Lytic bone metastases are seen involving the right sacrum and right pubis.  IMPRESSION: Spiculated 2.8 cm pulmonary nodule in posterior right upper lobe, consistent with primary bronchogenic carcinoma.  Mild right hilar lymphadenopathy, consistent with metastatic disease.  Diffuse bone metastases, with spinal cord compression again demonstrated at level of T8. See recent thoracic spine CT report.  Diffuse liver metastases.  Left posterior abdominal wall soft tissue metastasis.  Mild lymphadenopathy in the porta hepatis and right external iliac chains, consistent with metastatic disease.   Electronically Signed   By: Earle Gell M.D.   On: 04/27/2014 15:06   Ct Thoracic Spine Wo Contrast  04/26/2014   CLINICAL DATA:  74 year old who fell out of wheelchair 6 days ago now unable  to move bilateral lower extremities and numbness from the waist down. Initial encounter.  EXAM: CT THORACIC AND LUMBAR SPINE WITHOUT CONTRAST  TECHNIQUE: Multidetector CT imaging of the thoracic and lumbar spine was performed without contrast. Multiplanar CT image reconstructions were also generated.  COMPARISON:  Chest radiographs 1928 hr the same day. Chest CTA 11/10/2005.  FINDINGS: CT THORACIC SPINE FINDINGS  Spiculated right upper lobe lung mass measuring 31 mm longus dimension. Severe underlying emphysema. No definite mediastinal or hilar lymphadenopathy.  Diffuse osteopenia with thoracolumbar compression fractures sparing only the T1, and T3 levels.  At the T8 level there is a destructive soft tissue mass  which has eroded most of the T8 posterior elements, the right T8 pedicle, and has very likely invaded the thoracic spinal canal. Suspect thoracic spinal cord compression at this level in this setting.  No other destructive osseous lesion identified in the thoracic spine.  CT LUMBAR SPINE FINDINGS  Osteopenia and diffuse lumbar compression fractures.  Superimposed destructive mass of the right sacral ala which has eroded into the right sacral epidural space and likely affects the exiting right S1 nerve root.  Aortoiliac calcified atherosclerosis noted. There is a left paraspinal muscle mass at the L2-L3 level measuring about 34 mm diameter. Limited evaluation of the abdominal and pelvic viscera, with no acute visceral abnormality is evident.  IMPRESSION: 1. Spiculated right lung mass and destructive T8 spinal mass, most compatible with metastatic lung cancer. Spinal canal invasion at T8, almost certainly with cord compression in this setting. Thoracic MRI (without and with contrast preferred) would evaluate further if necessary. 2. Destructive soft tissue mass right sacral ala involving the right S1 nerve. 3. 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 4. Diffuse spinal compression fractures sparing only the T1 and T3 levels in the thoracolumbar spine. Critical Value/emergent results were called by telephone at the time of interpretation on 04/26/2014 at 9:21 pm to Dr. Quintella Reichert , who verbally acknowledged these results.   Electronically Signed   By: Genevie Ann M.D.   On: 04/26/2014 21:22   Ct Lumbar Spine Wo Contrast  04/26/2014   CLINICAL DATA:  74 year old who fell out of wheelchair 6 days ago now unable to move bilateral lower extremities and numbness from the waist down. Initial encounter.  EXAM: CT THORACIC AND LUMBAR SPINE WITHOUT CONTRAST  TECHNIQUE: Multidetector CT imaging of the thoracic and lumbar spine was performed without contrast. Multiplanar CT image reconstructions were also generated.   COMPARISON:  Chest radiographs 1928 hr the same day. Chest CTA 11/10/2005.  FINDINGS: CT THORACIC SPINE FINDINGS  Spiculated right upper lobe lung mass measuring 31 mm longus dimension. Severe underlying emphysema. No definite mediastinal or hilar lymphadenopathy.  Diffuse osteopenia with thoracolumbar compression fractures sparing only the T1, and T3 levels.  At the T8 level there is a destructive soft tissue mass which has eroded most of the T8 posterior elements, the right T8 pedicle, and has very likely invaded the thoracic spinal canal. Suspect thoracic spinal cord compression at this level in this setting.  No other destructive osseous lesion identified in the thoracic spine.  CT LUMBAR SPINE FINDINGS  Osteopenia and diffuse lumbar compression fractures.  Superimposed destructive mass of the right sacral ala which has eroded into the right sacral epidural space and likely affects the exiting right S1 nerve root.  Aortoiliac calcified atherosclerosis noted. There is a left paraspinal muscle mass at the L2-L3 level measuring about 34 mm diameter. Limited evaluation of the abdominal and  pelvic viscera, with no acute visceral abnormality is evident.  IMPRESSION: 1. Spiculated right lung mass and destructive T8 spinal mass, most compatible with metastatic lung cancer. Spinal canal invasion at T8, almost certainly with cord compression in this setting. Thoracic MRI (without and with contrast preferred) would evaluate further if necessary. 2. Destructive soft tissue mass right sacral ala involving the right S1 nerve. 3. 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 4. Diffuse spinal compression fractures sparing only the T1 and T3 levels in the thoracolumbar spine. Critical Value/emergent results were called by telephone at the time of interpretation on 04/26/2014 at 9:21 pm to Dr. Quintella Reichert , who verbally acknowledged these results.   Electronically Signed   By: Genevie Ann M.D.   On: 04/26/2014 21:22    Ct Abdomen Pelvis W Contrast  04/27/2014   CLINICAL DATA:  Bone metastases.  Right lung mass.  COPD.  EXAM: CT CHEST, ABDOMEN, AND PELVIS WITH CONTRAST  TECHNIQUE: Multidetector CT imaging of the chest, abdomen and pelvis was performed following the standard protocol during bolus administration of intravenous contrast.  CONTRAST:  131mL OMNIPAQUE IOHEXOL 300 MG/ML  SOLN  COMPARISON:  Chest CT only on 11/10/2005  FINDINGS: CT CHEST FINDINGS  Mediastinum/Lymph Nodes: No mediastinal lymphadenopathy identified. Mild right hilar lymphadenopathy is seen measuring 13 mm on image 33/series 2.  Lungs/Pleura: Superior pulmonary emphysema. A spiculated mass is seen in the posterior right upper lobe measuring 2.8 x 2.1 cm, consistent with primary bronchogenic carcinoma. No other suspicious masses are seen. 8 mm nodule in the peripheral right lower lobe is stable, consistent with benign etiology. No evidence of pleural effusion.  Musculoskeletal/Soft Tissues: Multiple lytic bone metastases are seen within the thoracic spine and ribs, with spinal cord compression noted at the level of T8, as demonstrated on recent spine CT.  CT ABDOMEN AND PELVIS FINDINGS  Hepatobiliary: Diffuse liver metastases are seen throughout the right and left hepatic lobes. Gallbladder is unremarkable. No evidence of biliary ductal dilatation.  Pancreas: No mass, inflammatory changes, or other parenchymal abnormality identified.  Spleen:  Within normal limits in size and appearance.  Adrenal Glands:  No mass identified.  Kidneys/Urinary Tract: No masses identified. No evidence of hydronephrosis. Foley catheter seen in bladder, which is decompressed.  Stomach/Bowel/Peritoneum: No evidence of wall thickening, mass, or obstruction.  Vascular/Lymphatic: Mild upper abdominal lymphadenopathy is seen within the porta hepatis. Mild lymphadenopathy is also seen in the pelvis in the right external iliac chain, with largest lymph node measuring 1.8 cm on image  96. Artifact from right hip prosthesis obscures visualization in the lower pelvis.  Reproductive:  No mass or other significant abnormality identified.  Other: Left posterior abdominal wall soft tissue mass is seen in the left paraspinal muscles at the level of L4 measuring 4.6 x 2.8 cm on image 68. This is consistent with abdominal wall metastasis.  Musculoskeletal: Lytic bone metastases are seen involving the right sacrum and right pubis.  IMPRESSION: Spiculated 2.8 cm pulmonary nodule in posterior right upper lobe, consistent with primary bronchogenic carcinoma.  Mild right hilar lymphadenopathy, consistent with metastatic disease.  Diffuse bone metastases, with spinal cord compression again demonstrated at level of T8. See recent thoracic spine CT report.  Diffuse liver metastases.  Left posterior abdominal wall soft tissue metastasis.  Mild lymphadenopathy in the porta hepatis and right external iliac chains, consistent with metastatic disease.   Electronically Signed   By: Earle Gell M.D.   On: 04/27/2014 15:06   Dg  Chest Port 1 View  04/26/2014   CLINICAL DATA:  74 year old male with numbness from the waist down. Wheelchair bound. Initial encounter.  EXAM: PORTABLE CHEST - 1 VIEW  COMPARISON:  06/18/2010.  FINDINGS: Three portable views of the chest. Chronic but progressed tortuosity of the thoracic aorta. Cardiac size and other mediastinal contours remain within normal limits. Chronic pulmonary hyperinflation. Multilevel thoracic compression fractures. Osteopenia.  In the peripheral left upper chest there is a rounded extrapleural type density which may be associated with the lateral fourth or fifth ribs, uncertain.  Otherwise allowing for portable technique the lungs are clear.  IMPRESSION: 1. Chronic but progressed tortuosity of the thoracic aorta. 2. Osteopenia with multilevel thoracic compression fractures. Possible left lateral fourth or fifth expansile rib lesion (arrow). 3. Chronic pulmonary  hyperinflation, no definite acute pulmonary opacity.   Electronically Signed   By: Genevie Ann M.D.   On: 04/26/2014 20:24    Labs:  CBC:  Recent Labs  04/26/14 1850 04/27/14 0130 04/29/14 0525  WBC 5.6 6.0 8.5  HGB 10.8* 10.1* 9.1*  HCT 33.2* 30.4* 28.3*  PLT 230 235 227    COAGS:  Recent Labs  04/29/14 0525  INR 1.15  APTT 31    BMP:  Recent Labs  04/26/14 1850 04/27/14 0130 04/29/14 0525  NA 137 137 135  K 4.4 4.3 5.0  CL 95* 94* 97  CO2 37* 35* 35*  GLUCOSE 105* 122* 124*  BUN 37* 43* 33*  CALCIUM 9.3 9.0 9.0  CREATININE 0.77 0.78 0.52  GFRNONAA 89* 88* >90  GFRAA >90 >90 >90    LIVER FUNCTION TESTS:  Recent Labs  04/26/14 1850 04/27/14 0130  BILITOT 0.8 0.6  AST 84* 83*  ALT 37 35  ALKPHOS 221* 207*  PROT 6.4 6.4  ALBUMIN 2.7* 2.7*    TUMOR MARKERS: No results for input(s): AFPTM, CEA, CA199, CHROMGRNA in the last 8760 hours.  Assessment and Plan: Timothy Gonzalez is a 74 y.o. male, long term smoker, with history of COPD, hypertension who started experiencing weakness of the lower extremites on February 6. He was unable to move his lower extremities and his weakness persisted so he came to the ER 2/12 . CT revealed spiculated 2.8 cm pulmonary nodule in posterior right upper lobe, consistent with primary bronchogenic carcinoma, mild right hilar lymphadenopathy, consistent with metastatic disease, diffuse bone metastases, with spinal cord compression demonstrated at level of T8, diffuse liver metastases, left posterior abdominal wall soft tissue metastasis (L3-4 region),mild lymphadenopathy in the porta hepatis and right external iliac chains, consistent with metastatic disease. On-call neurosurgeon Dr. Ronnald Ramp was consulted and at this time Dr. Ronnald Ramp felt that the patient was not a candidate for surgery. Radiation oncology has seen pt and he is also scheduled for first treatment today. Request now received for image guided biopsy for definitive  diagnosis. Imaging studies have been reviewed by Dr. Annamaria Boots and he feels safest site to biopsy would be left paraspinal ST mass at L3-4 region. Details/risks of procedure d/w pt with his understanding and consent. Procedure planned for later today.      Signed: Autumn Messing 04/29/2014, 9:50 AM   I spent a total of 20 minutes face to face in clinical consultation, greater than 50% of which was counseling/coordinating care for US guided left paraspinal ST mass biopsy.

## 2014-04-29 NOTE — Procedures (Signed)
Successful [place] bx of the LT PARASPINOUS MASS NO COMP STABLE PATH PENDING FULL REPORT IN PACS

## 2014-04-29 NOTE — Progress Notes (Signed)
Spoke with Rosaria Ferries, RN for pt in rm [id number]. She reports patient is alert, oriented, denies pain, is paralyzed from waist down, on O 2 @ 4L/min, has Foley cath, IVF in RFA- NS @ 50 ML/hr. Informed her that Dr Isidore Moos wants pt in [place] at 10 am for consult, and then pt will have ct sim. Informed her pt to be treated this afternoon for first fraction of radiation. Rosaria Ferries verbalized understanding. Scott, patient transporter informed and given pertinent information on patient.

## 2014-04-29 NOTE — Progress Notes (Signed)
Addendum: Of note, consult note should read "INPATIENT" Consult Dx code is C79.51  -----------------------------------  Eppie Gibson, MD

## 2014-04-29 NOTE — Progress Notes (Signed)
Radiation Oncology         [phone]) [phone] ________________________________  Initial outpatient Consultation  Name: Timothy Gonzalez MRN: [id number]  Date: 04/26/2014  DOB: 10-30-1940  YP:PJKDTO, Annie Main, MD  No ref. 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 male who presented with lower extremity weakness and numbness.  Patient appears to have a cord compression at T8, with lack of sensation in legs for 2 weeks and inability to move legs for 1 week.  Decadron has been started.  He is not a surgical candidate and cannot have an MRI due to metal in his femur. He appears to have a lung mass and widespread mets including bones and liver. His CT scans were reviewed this AM at [facility] tumor board.   He denies pain or HAs.  He reports he has lost about 15 lb or so in the past several months.  He has a tobacco history and COPD but quit smoking a few years ago.      PREVIOUS RADIATION THERAPY: No  PAST MEDICAL HISTORY:  has a past medical history of COPD (chronic obstructive pulmonary disease); Celiac disease; and Asthma.    PAST SURGICAL HISTORY: Past Surgical History  Procedure Laterality Date  . Total hip arthroplasty  2005    right     FAMILY HISTORY: family history is not on file.  SOCIAL HISTORY:  reports that he quit smoking about 3 years ago. His smoking use included Cigarettes. He has a 48 pack-year smoking history. He has never used smokeless tobacco. He reports that he drinks alcohol. He reports that he does not use illicit drugs.  ALLERGIES: Barley grass and Wheat  MEDICATIONS:  Current Facility-Administered Medications  Medication Dose Route Frequency Provider Last Rate Last Dose  . 0.9 %  sodium chloride infusion   Intravenous Continuous Rhetta Mura Schorr, NP 50 mL/hr at 04/29/14 1947    . acetaminophen (TYLENOL) tablet 650 mg   650 mg Oral Q6H PRN Rise Patience, MD       Or  . acetaminophen (TYLENOL) suppository 650 mg  650 mg Rectal Q6H PRN Rise Patience, MD      . albuterol (PROVENTIL) (2.5 MG/3ML) 0.083% nebulizer solution 2.5 mg  2.5 mg Nebulization Q4H PRN Rise Patience, MD   2.5 mg at 04/28/14 1610  . amLODipine (NORVASC) tablet 5 mg  5 mg Oral Daily Rise Patience, MD   5 mg at 04/28/14 1010  . antiseptic oral rinse (CPC / CETYLPYRIDINIUM CHLORIDE 0.05%) solution 7 mL  7 mL Mouth Rinse q12n4p Rise Patience, MD   7 mL at 04/28/14 1141  . budesonide-formoterol (SYMBICORT) 160-4.5 MCG/ACT inhaler 2 puff  2 puff Inhalation BID Rise Patience, MD   2 puff at 04/29/14 [phone]  . calcium-vitamin D (OSCAL WITH D) 500-200 MG-UNIT per tablet 1 tablet  1 tablet Oral Q breakfast Rise Patience, MD   1 tablet at 04/28/14 0810  . chlorhexidine (PERIDEX) 0.12 % solution 15 mL  15 mL Mouth Rinse BID Rise Patience, MD   15 mL at 04/27/14 1940  . dexamethasone (DECADRON) injection 4 mg  4 mg Intravenous 4 times per day Venetia Maxon Rama, MD   4 mg at 04/29/14 0527  . enoxaparin (LOVENOX) injection 40 mg  40 mg Subcutaneous Q24H Rise Patience, MD   40 mg at 04/28/14  1011  . feeding supplement (ENSURE COMPLETE) (ENSURE COMPLETE) liquid 237 mL  237 mL Oral TID WC Clayton Bibles, RD   237 mL at 04/28/14 1726  . LORazepam (ATIVAN) injection 0.5 mg  0.5 mg Intravenous Q4H PRN Christina P Rama, MD      . morphine 2 MG/ML injection 0.5 mg  0.5 mg Intravenous Q4H PRN Rise Patience, MD      . multivitamin with minerals tablet 1 tablet  1 tablet Oral Daily Rise Patience, MD   1 tablet at 04/28/14 1010  . ondansetron (ZOFRAN) tablet 4 mg  4 mg Oral Q6H PRN Rise Patience, MD       Or  . ondansetron [facility]) injection 4 mg  4 mg Intravenous Q6H PRN Rise Patience, MD      . pneumococcal 23 valent vaccine (PNU-IMMUNE) injection 0.5 mL  0.5 mL Intramuscular Tomorrow-1000 Rise Patience, MD      . tiotropium [facility]) inhalation capsule 18 mcg  18 mcg Inhalation Daily Rise Patience, MD   18 mcg at 04/29/14 5397    REVIEW OF SYSTEMS:  Notable for that above.   PHYSICAL EXAM:  height is 6\' 3"  (1.905 m) and weight is 124 lb 9 oz (56.5 kg). His oral temperature is 98 F (36.7 C). His blood pressure is 108/72 and his pulse is 110. His respiration is 18 and oxygen saturation is 100%.   General: Alert and oriented, in no acute distress HEENT: Head is normocephalic. Extraocular movements are intact. Oropharynx is clear. No thrush Heart: Regular in rate and rhythm with no murmurs, rubs, or gallops. Chest: Clear to auscultation bilaterally, with no rhonchi, wheezes, or rales. Abdomen: Soft, nontender, nondistended, with no rigidity or guarding. Extremities: No cyanosis or edema. Musculoskeletal:no movement in feet, toes or legs.  Arm strength intact. Neurologic: Cranial nerves II through XII are grossly intact. No obvious focalities. Speech is fluent. No movement in feet, toes or legs. Significantly decreased sensation from just above the umbilicus and downward to the toes. Psychiatric: Judgment and insight are intact. Affect is appropriate.   ECOG = 4  0 - Asymptomatic (Fully active, able to carry on all predisease activities without restriction)  1 - Symptomatic but completely ambulatory (Restricted in physically strenuous activity but ambulatory and able to carry out work of a light or sedentary nature. For example, light housework, office work)  2 - Symptomatic, <50% in bed during the day (Ambulatory and capable of all self care but unable to carry out any work activities. Up and about more than 50% of waking hours)  3 - Symptomatic, >50% in bed, but not bedbound (Capable of only limited self-care, confined to bed or chair 50% or more of waking hours)  4 - Bedbound (Completely disabled. Cannot carry on any self-care. Totally confined to bed or chair)  5 -  Death   Eustace Pen MM, Creech RH, Tormey DC, et al. [phone]). "Toxicity and response criteria of the [facility]". [place] Oncol. 5 (6): 649-55   LABORATORY DATA:  Lab Results  Component Value Date   WBC 8.5 04/29/2014   HGB 9.1* 04/29/2014   HCT 28.3* 04/29/2014   MCV 94.6 04/29/2014   PLT 227 04/29/2014   CMP     Component Value Date/Time   NA 135 04/29/2014 0525   K 5.0 04/29/2014 0525   CL 97 04/29/2014 0525   CO2 35* 04/29/2014 0525   GLUCOSE 124* 04/29/2014 0525  BUN 33* 04/29/2014 0525   CREATININE 0.52 04/29/2014 0525   CALCIUM 9.0 04/29/2014 0525   PROT 6.4 04/27/2014 0130   ALBUMIN 2.7* 04/27/2014 0130   AST 83* 04/27/2014 0130   ALT 35 04/27/2014 0130   ALKPHOS 207* 04/27/2014 0130   BILITOT 0.6 04/27/2014 0130   GFRNONAA >90 04/29/2014 0525   GFRAA >90 04/29/2014 0525         RADIOGRAPHY: Ct Chest W Contrast  04/27/2014   CLINICAL DATA:  Bone metastases.  Right lung mass.  COPD.  EXAM: CT CHEST, ABDOMEN, AND PELVIS WITH CONTRAST  TECHNIQUE: Multidetector CT imaging of the chest, abdomen and pelvis was performed following the standard protocol during bolus administration of intravenous contrast.  CONTRAST:  126mL OMNIPAQUE IOHEXOL 300 MG/ML  SOLN  COMPARISON:  Chest CT only on 11/10/2005  FINDINGS: CT CHEST FINDINGS  Mediastinum/Lymph Nodes: No mediastinal lymphadenopathy identified. Mild right hilar lymphadenopathy is seen measuring 13 mm on image 33/series 2.  Lungs/Pleura: Superior pulmonary emphysema. A spiculated mass is seen in the posterior right upper lobe measuring 2.8 x 2.1 cm, consistent with primary bronchogenic carcinoma. No other suspicious masses are seen. 8 mm nodule in the peripheral right lower lobe is stable, consistent with benign etiology. No evidence of pleural effusion.  Musculoskeletal/Soft Tissues: Multiple lytic bone metastases are seen within the thoracic spine and ribs, with spinal cord compression noted at the level of  T8, as demonstrated on recent spine CT.  CT ABDOMEN AND PELVIS FINDINGS  Hepatobiliary: Diffuse liver metastases are seen throughout the right and left hepatic lobes. Gallbladder is unremarkable. No evidence of biliary ductal dilatation.  Pancreas: No mass, inflammatory changes, or other parenchymal abnormality identified.  Spleen:  Within normal limits in size and appearance.  Adrenal Glands:  No mass identified.  Kidneys/Urinary Tract: No masses identified. No evidence of hydronephrosis. Foley catheter seen in bladder, which is decompressed.  Stomach/Bowel/Peritoneum: No evidence of wall thickening, mass, or obstruction.  Vascular/Lymphatic: Mild upper abdominal lymphadenopathy is seen within the porta hepatis. Mild lymphadenopathy is also seen in the pelvis in the right external iliac chain, with largest lymph node measuring 1.8 cm on image 96. Artifact from right hip prosthesis obscures visualization in the lower pelvis.  Reproductive:  No mass or other significant abnormality identified.  Other: Left posterior abdominal wall soft tissue mass is seen in the left paraspinal muscles at the level of L4 measuring 4.6 x 2.8 cm on image 68. This is consistent with abdominal wall metastasis.  Musculoskeletal: Lytic bone metastases are seen involving the right sacrum and right pubis.  IMPRESSION: Spiculated 2.8 cm pulmonary nodule in posterior right upper lobe, consistent with primary bronchogenic carcinoma.  Mild right hilar lymphadenopathy, consistent with metastatic disease.  Diffuse bone metastases, with spinal cord compression again demonstrated at level of T8. See recent thoracic spine CT report.  Diffuse liver metastases.  Left posterior abdominal wall soft tissue metastasis.  Mild lymphadenopathy in the porta hepatis and right external iliac chains, consistent with metastatic disease.   Electronically Signed   By: Earle Gell M.D.   On: 04/27/2014 15:06   Ct Thoracic Spine Wo Contrast  04/26/2014   CLINICAL  DATA:  74 year old who fell out of wheelchair 6 days ago now unable to move bilateral lower extremities and numbness from the waist down. Initial encounter.  EXAM: CT THORACIC AND LUMBAR SPINE WITHOUT CONTRAST  TECHNIQUE: Multidetector CT imaging of the thoracic and lumbar spine was performed without contrast. Multiplanar CT image reconstructions  were also generated.  COMPARISON:  Chest radiographs 1928 hr the same day. Chest CTA 11/10/2005.  FINDINGS: CT THORACIC SPINE FINDINGS  Spiculated right upper lobe lung mass measuring 31 mm longus dimension. Severe underlying emphysema. No definite mediastinal or hilar lymphadenopathy.  Diffuse osteopenia with thoracolumbar compression fractures sparing only the T1, and T3 levels.  At the T8 level there is a destructive soft tissue mass which has eroded most of the T8 posterior elements, the right T8 pedicle, and has very likely invaded the thoracic spinal canal. Suspect thoracic spinal cord compression at this level in this setting.  No other destructive osseous lesion identified in the thoracic spine.  CT LUMBAR SPINE FINDINGS  Osteopenia and diffuse lumbar compression fractures.  Superimposed destructive mass of the right sacral ala which has eroded into the right sacral epidural space and likely affects the exiting right S1 nerve root.  Aortoiliac calcified atherosclerosis noted. There is a left paraspinal muscle mass at the L2-L3 level measuring about 34 mm diameter. Limited evaluation of the abdominal and pelvic viscera, with no acute visceral abnormality is evident.  IMPRESSION: 1. Spiculated right lung mass and destructive T8 spinal mass, most compatible with metastatic lung cancer. Spinal canal invasion at T8, almost certainly with cord compression in this setting. Thoracic MRI (without and with contrast preferred) would evaluate further if necessary. 2. Destructive soft tissue mass right sacral ala involving the right S1 nerve. 3. Left paraspinal muscle metastasis  at the L2-L3 level. 4. Diffuse spinal compression fractures sparing only the T1 and T3 levels in the thoracolumbar spine. Critical Value/emergent results were called by telephone at the time of interpretation on 04/26/2014 at 9:21 pm to Dr. Quintella Reichert , who verbally acknowledged these results.   Electronically Signed   By: Genevie Ann M.D.   On: 04/26/2014 21:22   Ct Lumbar Spine Wo Contrast  04/26/2014   CLINICAL DATA:  74 year old who fell out of wheelchair 6 days ago now unable to move bilateral lower extremities and numbness from the waist down. Initial encounter.  EXAM: CT THORACIC AND LUMBAR SPINE WITHOUT CONTRAST  TECHNIQUE: Multidetector CT imaging of the thoracic and lumbar spine was performed without contrast. Multiplanar CT image reconstructions were also generated.  COMPARISON:  Chest radiographs 1928 hr the same day. Chest CTA 11/10/2005.  FINDINGS: CT THORACIC SPINE FINDINGS  Spiculated right upper lobe lung mass measuring 31 mm longus dimension. Severe underlying emphysema. No definite mediastinal or hilar lymphadenopathy.  Diffuse osteopenia with thoracolumbar compression fractures sparing only the T1, and T3 levels.  At the T8 level there is a destructive soft tissue mass which has eroded most of the T8 posterior elements, the right T8 pedicle, and has very likely invaded the thoracic spinal canal. Suspect thoracic spinal cord compression at this level in this setting.  No other destructive osseous lesion identified in the thoracic spine.  CT LUMBAR SPINE FINDINGS  Osteopenia and diffuse lumbar compression fractures.  Superimposed destructive mass of the right sacral ala which has eroded into the right sacral epidural space and likely affects the exiting right S1 nerve root.  Aortoiliac calcified atherosclerosis noted. There is a left paraspinal muscle mass at the L2-L3 level measuring about 34 mm diameter. Limited evaluation of the abdominal and pelvic viscera, with no acute visceral abnormality  is evident.  IMPRESSION: 1. Spiculated right lung mass and destructive T8 spinal mass, most compatible with metastatic lung cancer. Spinal canal invasion at T8, almost certainly with cord compression in this setting. Thoracic  MRI (without and with contrast preferred) would evaluate further if necessary. 2. Destructive soft tissue mass right sacral ala involving the right S1 nerve. 3. 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 4. Diffuse spinal compression fractures sparing only the T1 and T3 levels in the thoracolumbar spine. Critical Value/emergent results were called by telephone at the time of interpretation on 04/26/2014 at 9:21 pm to Dr. Quintella Reichert , who verbally acknowledged these results.   Electronically Signed   By: Genevie Ann M.D.   On: 04/26/2014 21:22   Ct Abdomen Pelvis W Contrast  04/27/2014   CLINICAL DATA:  Bone metastases.  Right lung mass.  COPD.  EXAM: CT CHEST, ABDOMEN, AND PELVIS WITH CONTRAST  TECHNIQUE: Multidetector CT imaging of the chest, abdomen and pelvis was performed following the standard protocol during bolus administration of intravenous contrast.  CONTRAST:  13mL OMNIPAQUE IOHEXOL 300 MG/ML  SOLN  COMPARISON:  Chest CT only on 11/10/2005  FINDINGS: CT CHEST FINDINGS  Mediastinum/Lymph Nodes: No mediastinal lymphadenopathy identified. Mild right hilar lymphadenopathy is seen measuring 13 mm on image 33/series 2.  Lungs/Pleura: Superior pulmonary emphysema. A spiculated mass is seen in the posterior right upper lobe measuring 2.8 x 2.1 cm, consistent with primary bronchogenic carcinoma. No other suspicious masses are seen. 8 mm nodule in the peripheral right lower lobe is stable, consistent with benign etiology. No evidence of pleural effusion.  Musculoskeletal/Soft Tissues: Multiple lytic bone metastases are seen within the thoracic spine and ribs, with spinal cord compression noted at the level of T8, as demonstrated on recent spine CT.  CT ABDOMEN AND PELVIS FINDINGS   Hepatobiliary: Diffuse liver metastases are seen throughout the right and left hepatic lobes. Gallbladder is unremarkable. No evidence of biliary ductal dilatation.  Pancreas: No mass, inflammatory changes, or other parenchymal abnormality identified.  Spleen:  Within normal limits in size and appearance.  Adrenal Glands:  No mass identified.  Kidneys/Urinary Tract: No masses identified. No evidence of hydronephrosis. Foley catheter seen in bladder, which is decompressed.  Stomach/Bowel/Peritoneum: No evidence of wall thickening, mass, or obstruction.  Vascular/Lymphatic: Mild upper abdominal lymphadenopathy is seen within the porta hepatis. Mild lymphadenopathy is also seen in the pelvis in the right external iliac chain, with largest lymph node measuring 1.8 cm on image 96. Artifact from right hip prosthesis obscures visualization in the lower pelvis.  Reproductive:  No mass or other significant abnormality identified.  Other: Left posterior abdominal wall soft tissue mass is seen in the left paraspinal muscles at the level of L4 measuring 4.6 x 2.8 cm on image 68. This is consistent with abdominal wall metastasis.  Musculoskeletal: Lytic bone metastases are seen involving the right sacrum and right pubis.  IMPRESSION: Spiculated 2.8 cm pulmonary nodule in posterior right upper lobe, consistent with primary bronchogenic carcinoma.  Mild right hilar lymphadenopathy, consistent with metastatic disease.  Diffuse bone metastases, with spinal cord compression again demonstrated at level of T8. See recent thoracic spine CT report.  Diffuse liver metastases.  Left posterior abdominal wall soft tissue metastasis.  Mild lymphadenopathy in the porta hepatis and right external iliac chains, consistent with metastatic disease.   Electronically Signed   By: Earle Gell M.D.   On: 04/27/2014 15:06   US Biopsy  04/29/2014   CLINICAL DATA:  RIGHT UPPER LOBE LUNG MASS WITH EVIDENCE OF METASTATIC DISEASE IN THE LIVER, OSSEOUS  STRUCTURES, AND SOFT TISSUES. FINDINGS COMPATIBLE WITH METASTATIC LUNG CANCER.  EXAM: ULTRASOUND GUIDED CORE BIOPSY OF LEFT PARASPINOUS  MUSCULATURE SOFT TISSUE MASS BIOPSY  MEDICATIONS: 1% LIDOCAINE LOCALLY  PROCEDURE: The procedure, risks, benefits, and alternatives were explained to the patient. Questions regarding the procedure were encouraged and answered. The patient understands and consents to the procedure.  The left L4 paraspinous musculature was prepped with Betadine in a sterile fashion, and a sterile drape was applied covering the operative field. A sterile gown and sterile gloves were used for the procedure. Local anesthesia was provided with 1% Lidocaine.  Previous imaging reviewed. Patient positioned right side down decubitus. The paraspinous musculature soft tissue mass was localized with ultrasound. Under sterile conditions and local anesthesia, a 16 gauge core biopsy needle was advanced into the lesion. Four core biopsies obtained. Samples placed in formalin. Postprocedure imaging demonstrates no hemorrhage or hematoma. Patient tolerated the biopsy well.  COMPLICATIONS: None.  FINDINGS: Imaging confirms needle placement in the left paraspinous musculature soft tissue mass or core biopsy  IMPRESSION: Successful ultrasound left paraspinous musculature soft tissue mass 16 gauge core biopsy   Electronically Signed   By: Jerilynn Mages.  Shick M.D.   On: 04/29/2014 16:04   Dg Chest Port 1 View  04/26/2014   CLINICAL DATA:  74 year old male with numbness from the waist down. Wheelchair bound. Initial encounter.  EXAM: PORTABLE CHEST - 1 VIEW  COMPARISON:  06/18/2010.  FINDINGS: Three portable views of the chest. Chronic but progressed tortuosity of the thoracic aorta. Cardiac size and other mediastinal contours remain within normal limits. Chronic pulmonary hyperinflation. Multilevel thoracic compression fractures. Osteopenia.  In the peripheral left upper chest there is a rounded extrapleural type density which  may be associated with the lateral fourth or fifth ribs, uncertain.  Otherwise allowing for portable technique the lungs are clear.  IMPRESSION: 1. Chronic but progressed tortuosity of the thoracic aorta. 2. Osteopenia with multilevel thoracic compression fractures. Possible left lateral fourth or fifth expansile rib lesion (arrow). 3. Chronic pulmonary hyperinflation, no definite acute pulmonary opacity.   Electronically Signed   By: Genevie Ann M.D.   On: 04/26/2014 20:24      IMPRESSION/PLAN: Today, I talked to the patient about the findings and work-up thus far. We discussed the patient's diagnosis of putative metastatic cancer, biopsy pending for today. He has a cord compression. He appears to have diffuse bone mets with liver metastases and a lung mass as well. The patient was not surprised by the putative cancer diagnosis but disappointed to hear it has spread to his liver. We discussed the general treatment for spinal cord compression, highlighting the role of radiotherapy in the management. We discussed the available radiation techniques, and focused on the details of logistics and delivery.    We discussed the risks, benefits, and side effects of radiotherapy. Side effects may include but not necessarily be limited to: esophagitis, skin irritation, fatigue.  It is unlikely he will be able walk again given his duration of paralysis and limited response to decadron, if any.  But, he would like to try radiotherapy and I think he will tolerate it reasonably well.  Anticipate 30 Gy/10 fractions to T6-T10.  I am willing to start RT today before biopsy results are available. He is willing to pursue RT even though there is a tiny chance he does not have cancer. Of note, patient reports paraspinal mass at site of planned biopsy is an area where he used to have a large blackhead. If this isn't diagnostic, he made need a biopsy elsewhere.  No guarantees of treatment were given. A consent form  was signed and  placed in the patient's medical record. The patient was encouraged to ask questions that I answered to the best of my ability.   Also, recommend  palliative care consult.  He is aware that his diagnosis is not curable, and seems to have a fair preliminary understanding of his condition as more information is being gathered to facilitate discussion of prognosis, goals of care, etc.  __________________________________________   Eppie Gibson, MD

## 2014-04-29 NOTE — Progress Notes (Signed)
OT Cancellation Note  Patient Details Name: Timothy Gonzalez MRN: [id number] DOB: 10/07/1940   Cancelled Treatment:    Reason Eval/Treat Not Completed: Patient at procedure or test/ unavailable. Pt off unit for radiation tx then for CT. Will re attempt later today as time allows/as appropriate  [name] 04/29/2014, 11:27 AM

## 2014-04-29 NOTE — Progress Notes (Signed)
Progress Note   [name] IWP:[id number] DOB: 10-Oct-1940 DOA: 04/26/2014 PCP: Orpah Melter, MD   Brief Narrative:   Timothy Gonzalez is an 74 y.o. male with a PMH of COPD, and hypertension who has had progressive lower extremity weakness since 04/20/14. CT of the T-spine and L-spine showed a lung mass and a spinal mass invading T8. On-call neurosurgeon Dr. Ronnald Ramp was consulted and at this time Dr. Ronnald Ramp felt that patient was not a candidate for surgery.  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 Ronnald Ramp who did not feel that the patient was a candidate for surgical intervention.  Continue Decadron.  Radiation oncology consultation requested.  Spoke with Dr. Isidore Moos who plans to initiate XRT on 04/30/14.  Staging scans show a spiculated 2.8 cm pulmonary nodule in the right upper lobe most consistent with primary bronchogenic carcinoma. There are diffuse liver and bone metastasis as well as metastatic lymphadenopathy consistent with stage IV disease.  Unable to have MRI brain due to metal in leg.  IR consulted for CT guided biopsy, which will be done today.  Active Problems:   Severe protein calorie malnutrition  Dietician consulted.  Ensure TID.    COPD (chronic obstructive pulmonary disease)  Currently stable.  Continue Albuterol PRN, Symbicort and Spiriva.      Hypertension  Continue Norvasc.    DVT Prophylaxis  Lovenox ordered.  Code Status: Full. Family Communication: Wife at bedside 04/27/14. Updated daughter Llana Aliment at [phone] by telephone 04/27/14 as well.  Other daughter Sigmund Hazel [phone]. Disposition Plan: Home versus SNF when diagnosis/treatment plan fully established.  Was getting around with a wheelchair.     IV Access:    Peripheral IV   Procedures and diagnostic studies:   Ct Thoracic/Lumbar Spine Wo Contrast 04/26/2014: 1. Spiculated right lung mass and  destructive T8 spinal mass, most compatible with metastatic lung cancer. Spinal canal invasion at T8, almost certainly with cord compression in this setting. Thoracic MRI (without and with contrast preferred) would evaluate further if necessary. 2. Destructive soft tissue mass right sacral ala involving the right S1 nerve. 3. 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 4. Diffuse spinal compression fractures sparing only the T1 and T3 levels in the thoracolumbar spine.   Dg Chest Port 1 View 04/26/2014: 1. Chronic but progressed tortuosity of the thoracic aorta. 2. Osteopenia with multilevel thoracic compression fractures. Possible left lateral fourth or fifth expansile rib lesion (arrow). 3. Chronic pulmonary hyperinflation, no definite acute pulmonary opacity.     Medical Consultants:    IR  Radiation Oncology  Anti-Infectives:    None.  Subjective:   Timothy Gonzalez is feeling well.  Denies pain, dyspnea, nausea or vomiting.  Last BM was 3 days ago.  Still unable to significantly move lower extremities.  Wants to eat.  Says he was able to drink 3 Ensures yesterday.  Objective:    Filed Vitals:   04/28/14 1400 04/28/14 2110 04/28/14 2115 04/29/14 0525  BP: 114/53 133/75  127/77  Pulse: 116 113  91  Temp: 97.9 F (36.6 C) 98.1 F (36.7 C)  97.6 F (36.4 C)  TempSrc: Oral Oral  Oral  Resp: 20 18  18   Height:      Weight:      SpO2: 100% 100% 98% 100%    Intake/Output Summary (Last 24 hours) at 04/29/14 0823 Last data filed at 04/29/14 0600  Gross per 24  hour  Intake   1800 ml  Output   1400 ml  Net    400 ml    Exam: Gen:  NAD, emaciated/cachectic Cardiovascular:  RRR, No M/R/G Respiratory:  Lungs diminished Gastrointestinal:  Abdomen soft, NT/ND, + BS Extremities:  No C/E/C Neuro: Very little voluntary movement of feet.  Reflexes intact.   Data Reviewed:    Labs: Basic Metabolic Panel:  Recent Labs Lab 04/26/14 1850 04/27/14 0130  04/29/14 0525  NA 137 137 135  K 4.4 4.3 5.0  CL 95* 94* 97  CO2 37* 35* 35*  GLUCOSE 105* 122* 124*  BUN 37* 43* 33*  CREATININE 0.77 0.78 0.52  CALCIUM 9.3 9.0 9.0   GFR Estimated Creatinine Clearance: 66.7 mL/min (by C-G formula based on Cr of 0.52). Liver Function Tests:  Recent Labs Lab 04/26/14 1850 04/27/14 0130  AST 84* 83*  ALT 37 35  ALKPHOS 221* 207*  BILITOT 0.8 0.6  PROT 6.4 6.4  ALBUMIN 2.7* 2.7*   CBC:  Recent Labs Lab 04/26/14 1850 04/27/14 0130 04/29/14 0525  WBC 5.6 6.0 8.5  NEUTROABS 4.3 4.6  --   HGB 10.8* 10.1* 9.1*  HCT 33.2* 30.4* 28.3*  MCV 93.3 93.5 94.6  PLT 230 235 227    Microbiology Recent Results (from the past 240 hour(s))  Urine culture     Status: None   Collection Time: 04/26/14  7:57 PM  Result Value Ref Range Status   Specimen Description URINE, CATHETERIZED  Final   Special Requests NONE  Final   Colony Count NO GROWTH Performed at [facility]   Final   Culture NO GROWTH Performed at [facility]   Final   Report Status 04/28/2014 FINAL  Final     Medications:   . albuterol  5 mg Nebulization Once  . amLODipine  5 mg Oral Daily  . antiseptic oral rinse  7 mL Mouth Rinse q12n4p  . budesonide-formoterol  2 puff Inhalation BID  . calcium-vitamin D  1 tablet Oral Q breakfast  . chlorhexidine  15 mL Mouth Rinse BID  . dexamethasone  4 mg Intravenous 4 times per day  . enoxaparin (LOVENOX) injection  40 mg Subcutaneous Q24H  . feeding supplement (ENSURE COMPLETE)  237 mL Oral TID WC  .  morphine injection  1 mg Intravenous Once  . multivitamin with minerals  1 tablet Oral Daily  . naproxen  250 mg Oral Once  . pneumococcal 23 valent vaccine  0.5 mL Intramuscular Tomorrow-1000  . tiotropium  18 mcg Inhalation Daily   Continuous Infusions: . sodium chloride 50 mL/hr at 04/29/14 0527    Time spent: 25 minutes.   LOS: 3 days   [facility] Pager [phone]. If unable to  reach me by pager, please call my cell phone at [phone].  *Please refer to [email or web address], password [email or web address] to get updated schedule on who will round on this patient, as hospitalists switch teams weekly. If 7PM-7AM, please contact night-coverage at [email or web address], password [email or web address] for any overnight needs.  04/29/2014, 8:23 AM

## 2014-04-29 NOTE — Progress Notes (Signed)
PT Cancellation Note  Patient Details Name: Timothy Gonzalez MRN: [id number] DOB: [birth year]   Cancelled Treatment:    Reason Eval/Treat Not Completed: Medical issues which prohibited therapy (for biopsy today. PT Would like to know if patient will need a TLSO for support when mobilizing> ).   Will check back later as schedule permits    Claretha Cooper 04/29/2014, 12:35 PM Tresa Endo PT [phone]

## 2014-04-30 ENCOUNTER — Ambulatory Visit
Admit: 2014-04-30 | Discharge: 2014-04-30 | Disposition: A | Discharge status: Home / Self Care | Payer: Medicare Other | Attending: Radiation Oncology | Admitting: Radiation Oncology

## 2014-04-30 DIAGNOSIS — C787 Secondary malignant neoplasm of liver and intrahepatic bile duct: Secondary | ICD-10-CM

## 2014-04-30 DIAGNOSIS — M5104 Intervertebral disc disorders with myelopathy, thoracic region: Secondary | ICD-10-CM

## 2014-04-30 DIAGNOSIS — C7951 Secondary malignant neoplasm of bone: Secondary | ICD-10-CM

## 2014-04-30 DIAGNOSIS — R0600 Dyspnea, unspecified: Secondary | ICD-10-CM

## 2014-04-30 DIAGNOSIS — M6281 Muscle weakness (generalized): Secondary | ICD-10-CM

## 2014-04-30 DIAGNOSIS — C3411 Malignant neoplasm of upper lobe, right bronchus or lung: Secondary | ICD-10-CM

## 2014-04-30 DIAGNOSIS — J449 Chronic obstructive pulmonary disease, unspecified: Secondary | ICD-10-CM

## 2014-04-30 DIAGNOSIS — E46 Unspecified protein-calorie malnutrition: Secondary | ICD-10-CM

## 2014-04-30 DIAGNOSIS — Z87891 Personal history of nicotine dependence: Secondary | ICD-10-CM

## 2014-04-30 DIAGNOSIS — R591 Generalized enlarged lymph nodes: Secondary | ICD-10-CM

## 2014-04-30 DIAGNOSIS — R609 Edema, unspecified: Secondary | ICD-10-CM

## 2014-04-30 MED ORDER — KETOROLAC TROMETHAMINE 30 MG/ML IJ SOLN
30.0000 mg | Freq: Every day | INTRAMUSCULAR | Status: DC
  Administered 2014-04-30 – 2014-05-01 (×2): 30 mg via INTRAVENOUS
  Filled 2014-04-30 (×4): qty 1

## 2014-04-30 MED ORDER — HYDROCODONE-ACETAMINOPHEN 5-325 MG PO TABS: 1.0000 | ORAL_TABLET | Freq: Four times a day (QID) | ORAL | Status: DC | PRN

## 2014-04-30 NOTE — Progress Notes (Signed)
OT Cancellation Note  Patient Details Name: Takuya Lariccia MRN: [id number] DOB: [birth year]   Cancelled Treatment:    Reason Eval/Treat Not Completed: Other (comment).  Noted dopplers ordered to r/o DVT.  Will check back.  Farrell Broerman 04/30/2014, 11:56 AM  Lesle Chris, OTR/L [phone] 04/30/2014

## 2014-04-30 NOTE — Progress Notes (Signed)
PT Cancellation Note  Patient Details Name: Timothy Gonzalez MRN: [id number] DOB: 02-27-1941   Cancelled Treatment:    Reason Eval/Treat Not Completed: Medical issues which prohibited therapy (to get Doppler to RO DVT. will check chart later today for results and clearance to evaluate patient as patient is able.)   Claretha Cooper 04/30/2014, 11:52 AM Tresa Endo PT [phone]

## 2014-04-30 NOTE — Progress Notes (Signed)
Bilateral lower extremity venous duplex completed.  Right:  No evidence of DVT, superficial thrombosis, or Baker's cyst.  Left: DVT noted in the common femoral, profunda, and femoral veins.  No evidence of superficial thrombosis.  No Baker's cyst.

## 2014-04-30 NOTE — Consult Note (Signed)
[facility]  [phone]) [phone] Fax:([phone]) [place] Note   Patient Care Team: Orpah Melter, MD as PCP - General (Family Medicine) 04/30/2014  CHIEF COMPLAINTS/PURPOSE OF CONSULTATION:  Newly diagnosed metastatic carcinoma, likely lung primary  HISTORY OF PRESENTING ILLNESS:  Timothy Gonzalez 74 y.o. male with past medical history of COPD, on home oxygen, hypertension who was admitted to [facility] for T8 cord compression on 10/26/2014. I was called to see him for probable metastatic lung cancer.Timothy Gonzalez  He presented with worsening bilateral lower extremity weakness for a week. He used to use walker and mobilized wheelchair at home, was able to walk for 50-100 feet without stopping with a walker before at home. He developed bilateral lower extremity weakness and and fell down at home on 04/20/2014. The wind weakness got quite progressively worse, to the point he was not able to move his feet and the leg at all. He denies any stool or urinary incontinence. He denies significant low back pain. He came into the emergency room on Febres 13 2016, CT scan reviewed her right upper lobe lung mass, diffuse liver and bone metastasis, and the T8 spinal mass with cord compression. He was everted by neurosurgeon Dr. Ronnald Ramp who felt he is not a candidate for surgery. He was subsequently developed by a radiation oncologist Dr. Isidore Moos and started palliative radiation yesterday.   He has chronic dyspnea at rest and on exertion, which has not changed much lately. He denies significant cough or chest pain no hemoptysis. His appetite has been low lately, he lost about 15-20 pounds in the past months. He denies any nausea, abdominal discomfort, fever, chill or other symptoms.    MEDICAL HISTORY:  Past Medical History  Diagnosis Date  . COPD (chronic obstructive pulmonary disease)   . Celiac disease   . Asthma     childhood    SURGICAL HISTORY: Past Surgical  History  Procedure Laterality Date  . Total hip arthroplasty  2005    right     SOCIAL HISTORY: History   Social History  . Marital Status: Married    Spouse Name: N/A  . Number of Children: 4  . Years of Education: N/A   Occupational History  . 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 Smoking status: Former Smoker -- 1.00 packs/day for 48 years    Types: Cigarettes    Quit date: 06/14/2010  . Smokeless tobacco: Never Used  . Alcohol Use: Yes     Comment: rare  . Drug Use: No  . Sexual Activity: Not on file   Other Topics Concern  . Not on file   Social History Narrative    FAMILY HISTORY: His son had leukemia at age of 39 , and cured by chemotherapy and bone marrow transplant. No other family history of malignancy.   ALLERGIES:  is allergic to barley grass and wheat.  MEDICATIONS:  Current Facility-Administered Medications  Medication Dose Route Frequency Provider Last Rate Last Dose  . 0.9 %  sodium chloride infusion   Intravenous Continuous Rhetta Mura Schorr, NP 50 mL/hr at 04/29/14 1947    . acetaminophen (TYLENOL) tablet 650 mg  650 mg Oral Q6H PRN Rise Patience, MD   650 mg at 04/29/14 2136   Or  . acetaminophen (TYLENOL) suppository 650 mg  650 mg Rectal Q6H PRN Rise Patience, MD      . albuterol (PROVENTIL) (2.5 MG/3ML) 0.083% nebulizer solution  2.5 mg  2.5 mg Nebulization Q4H PRN Rise Patience, MD   2.5 mg at 04/28/14 1610  . amLODipine (NORVASC) tablet 5 mg  5 mg Oral Daily Rise Patience, MD   5 mg at 04/28/14 1010  . budesonide-formoterol (SYMBICORT) 160-4.5 MCG/ACT inhaler 2 puff  2 puff Inhalation BID Rise Patience, MD   2 puff at 04/30/14 [phone]  . calcium-vitamin D (OSCAL WITH D) 500-200 MG-UNIT per tablet 1 tablet  1 tablet Oral Q breakfast Rise Patience, MD   1 tablet at 04/30/14 [phone]  . dexamethasone (DECADRON) injection 4 mg  4 mg Intravenous 4 times per day Venetia Maxon Rama, MD   4 mg at  04/30/14 0501  . enoxaparin (LOVENOX) injection 40 mg  40 mg Subcutaneous Q24H Rise Patience, MD   40 mg at 04/28/14 1011  . feeding supplement (ENSURE COMPLETE) (ENSURE COMPLETE) liquid 237 mL  237 mL Oral TID WC Clayton Bibles, RD   237 mL at 04/30/14 0823  . LORazepam (ATIVAN) injection 0.5 mg  0.5 mg Intravenous Q4H PRN Christina P Rama, MD      . morphine 2 MG/ML injection 0.5 mg  0.5 mg Intravenous Q4H PRN Rise Patience, MD      . multivitamin with minerals tablet 1 tablet  1 tablet Oral Daily Rise Patience, MD   1 tablet at 04/28/14 1010  . ondansetron (ZOFRAN) tablet 4 mg  4 mg Oral Q6H PRN Rise Patience, MD       Or  . ondansetron [facility]) injection 4 mg  4 mg Intravenous Q6H PRN Rise Patience, MD      . pneumococcal 23 valent vaccine (PNU-IMMUNE) injection 0.5 mL  0.5 mL Intramuscular Tomorrow-1000 Rise Patience, MD      . tiotropium [facility]) inhalation capsule 18 mcg  18 mcg Inhalation Daily Rise Patience, MD   18 mcg at 04/30/14 3220    REVIEW OF SYSTEMS:   Constitutional: Denies fevers, chills or abnormal night sweats, positive for weight loss and fatigue  Eyes: Denies blurriness of vision, double vision or watery eyes Ears, nose, mouth, throat, and face: Denies mucositis or sore throat Respiratory: Denies cough, dyspnea or wheezes Cardiovascular: Denies palpitation, chest discomfort or lower extremity swelling Gastrointestinal:  Denies nausea, heartburn or change in bowel habits Skin: Denies abnormal skin rashes Lymphatics: Denies new lymphadenopathy or easy bruising Neurological:see HPI  Behavioral/Psych: Mood is stable, no new changes  All other systems were reviewed with the patient and are negative.  PHYSICAL EXAMINATION: ECOG PERFORMANCE STATUS: 4 - Bedbound  Filed Vitals:   04/30/14 0628  BP: 118/73  Pulse: 105  Temp: 98.4 F (36.9 C)  Resp: 16   Filed Weights   04/27/14 0105  Weight: 124 lb 9 oz (56.5 kg)     GENERAL:alert, no distress and comfortable SKIN: Diffuse ecchymosis on bilateral forearms and lower extremities below knee. There are several small shallow skin ulcers on the bilateral lower extremities. EYES: normal, conjunctiva are pink and non-injected, sclera clear OROPHARYNX:no exudate, no erythema and lips, buccal mucosa, and tongue normal  NECK: supple, thyroid normal size, non-tender, without nodularity LYMPH:  no palpable lymphadenopathy in the cervical, axillary or inguinal LUNGS: Diminished breath sound in bilateral, no wheezing or rales.  HEART: regular rate & rhythm and no murmurs and no lower extremity edema ABDOMEN:abdomen soft, non-tender and normal bowel sounds Musculoskeletal:no cyanosis of digits and no clubbing  PSYCH: alert & oriented x 3  with fluent speech NEURO: Bilateral lower extremity strength 0/5, light touch sensation is still intact, no other neuro deficits.  LABORATORY DATA:  I have reviewed the data as listed Lab Results  Component Value Date   WBC 8.5 04/29/2014   HGB 9.1* 04/29/2014   HCT 28.3* 04/29/2014   MCV 94.6 04/29/2014   PLT 227 04/29/2014    Recent Labs  04/26/14 1850 04/27/14 0130 04/29/14 0525  NA 137 137 135  K 4.4 4.3 5.0  CL 95* 94* 97  CO2 37* 35* 35*  GLUCOSE 105* 122* 124*  BUN 37* 43* 33*  CREATININE 0.77 0.78 0.52  CALCIUM 9.3 9.0 9.0  GFRNONAA 89* 88* >90  GFRAA >90 >90 >90  PROT 6.4 6.4  --   ALBUMIN 2.7* 2.7*  --   AST 84* 83*  --   ALT 37 35  --   ALKPHOS 221* 207*  --   BILITOT 0.8 0.6  --     RADIOGRAPHIC STUDIES: I have personally reviewed the radiological images as listed and agreed with the findings in the report.  Ct Chest, abdomen and pelvis W Contrast 04/27/2014    IMPRESSION: Spiculated 2.8 cm pulmonary nodule in posterior right upper lobe, consistent with primary bronchogenic carcinoma.  Mild right hilar lymphadenopathy, consistent with metastatic disease.  Diffuse bone metastases, with spinal  cord compression again demonstrated at level of T8. See recent thoracic spine CT report.  Diffuse liver metastases.  Left posterior abdominal wall soft tissue metastasis.  Mild lymphadenopathy in the porta hepatis and right external iliac chains, consistent with metastatic disease.   Electronically Signed   By: Earle Gell M.D.   On: 04/27/2014 15:06   Ct Thoracic and lumbar Spine Wo Contrast 04/26/2014    IMPRESSION: 1. Spiculated right lung mass and destructive T8 spinal mass, most compatible with metastatic lung cancer. Spinal canal invasion at T8, almost certainly with cord compression in this setting. Thoracic MRI (without and with contrast preferred) would evaluate further if necessary. 2. Destructive soft tissue mass right sacral ala involving the right S1 nerve. 3. 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 4. Diffuse spinal compression fractures sparing only the T1 and T3 levels in the thoracolumbar spine. Critical Value/emergent results were called by telephone at the time of interpretation on 04/26/2014 at 9:21 pm to Dr. Quintella Reichert , who verbally acknowledged these results.   Electronically Signed   By: Genevie Ann M.D.   On: 04/26/2014 21:22    US Biopsy  04/29/2014   CLINICAL DATA:  RIGHT UPPER LOBE LUNG MASS WITH EVIDENCE OF METASTATIC DISEASE IN THE LIVER, OSSEOUS STRUCTURES, AND SOFT TISSUES. FINDINGS COMPATIBLE WITH METASTATIC LUNG CANCER.  EXAM: ULTRASOUND GUIDED CORE BIOPSY OF LEFT PARASPINOUS MUSCULATURE SOFT TISSUE MASS BIOPSY  MEDICATIONS: 1% LIDOCAINE LOCALLY  PROCEDURE: The procedure, risks, benefits, and alternatives were explained to the patient. Questions regarding the procedure were encouraged and answered. The patient understands and consents to the procedure.  The left L4 paraspinous musculature was prepped with Betadine in a sterile fashion, and a sterile drape was applied covering the operative field. A sterile gown and sterile gloves were used for the procedure. Local  anesthesia was provided with 1% Lidocaine.  Previous imaging reviewed. Patient positioned right side down decubitus. The paraspinous musculature soft tissue mass was localized with ultrasound. Under sterile conditions and local anesthesia, a 16 gauge core biopsy needle was advanced into the lesion. Four core biopsies obtained. Samples placed in formalin. Postprocedure imaging demonstrates  no hemorrhage or hematoma. Patient tolerated the biopsy well.  COMPLICATIONS: None.  FINDINGS: Imaging confirms needle placement in the left paraspinous musculature soft tissue mass or core biopsy  IMPRESSION: Successful ultrasound left paraspinous musculature soft tissue mass 16 gauge core biopsy   Electronically Signed   By: Jerilynn Mages.  Shick M.D.   On: 04/29/2014 16:04     ASSESSMENT & PLAN:  74 year old gentleman with a 50-pack-year smoking history, COPD on home oxygen, hypertension, presented with worsening bilateral lower extremity weakness, now paralyzed, CT scan showed a 2.8 cm right upper lobe lung mass, right hilar adenopathy, diffuse bone and liver metastasis, T8 cord compression.   1. 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 Saralyn Pilar his morning, the preliminary biopsy results showed squamous cell carcinoma, pending immunohistochemical staining to confirm. -Giving the heavy smoking history, and typical CT scan findings, this is most consistent with metastatic lung cancer. -Please obtain a brain MRI with and without contrast to finish staging. -I agree with active radiation to T8 lesion due to the cord compression. -Giving his extremely poor performance status, he is unlikely to be a candidate for systemic chemotherapy. If his able to improve son after her the radiation, I'll see him back in my clinic to consider immunotherapy with nivolumab. -I'll tentatively, hospice would be appropriate given his extremely poor performance status. I discussed the goal of care is  palliative, to preserve his quad of life and prolong his life. He strongly voiced he would like to fight with this cancer.  2. T8 cord compression with paraplegia  -On palliative radiation and steroids  3. Left lower extremity edema -Please obtain a ultrasound to rule out DVT  4. Malnutrition -Nutrition consult  5. COPD and hypertension -Management Per primary team  Recommendations -Brain MRI to complete staging, Doppler of lower extremities to ruled out DVT  I'll set up his follow-up appointment with me in my clinic before his discharge. Thank you for the opportunity to participate in his care.  Orders Placed This Encounter  Procedures  . Urine culture    Standing Status: Standing     Number of Occurrences: 1     Standing Expiration Date:   . DG Chest Port 1 View    Standing Status: Standing     Number of Occurrences: 1     Standing Expiration Date:     Order Specific Question:  Reason for exam:    Answer:  NUMBNESS  . CT Lumbar Spine Wo Contrast    Standing Status: Standing     Number of Occurrences: 1     Standing Expiration Date:     Order Specific Question:  Reason for exam:    Answer:  NUMBNESS  . CT Thoracic Spine Wo Contrast    Standing Status: Standing     Number of Occurrences: 1     Standing Expiration Date:     Order Specific Question:  Reason for exam:    Answer:  NUMBNESS  . CT Abdomen Pelvis W Contrast    Standing Status: Standing     Number of Occurrences: 1     Standing Expiration Date:     Order Specific Question:  Does the patient have a contrast media/X-ray dye allergy?    Answer:  No    Order Specific Question:  Symptom/Reason for Exam    Answer:  Spinal cord mass [[id number]]  . CT Chest W Contrast    Standing Status: Standing     Number  of Occurrences: 1     Standing Expiration Date:     Order Specific Question:  Does the patient have a contrast media/X-ray dye allergy?    Answer:  No    Order Specific Question:  Symptom/Reason for Exam     Answer:  Spinal cord mass [[id number]]  . US Biopsy    Left paraspinal mass biopsy; CC; Dr. Rockne Menghini    Standing Status: Standing     Number of Occurrences: 1     Standing Expiration Date:     Order Specific Question:  Lab orders requested (DO NOT place separate lab orders, these will be automatically ordered during procedure specimen collection):    Answer:  Surgical Pathology    Order Specific Question:  Can the patient sign their own consent?    Answer:  Yes    Order Specific Question:  Symptom/Reason for Exam    Answer:  Paraspinal mass [[id number]]  . CBC with Differential    Standing Status: Standing     Number of Occurrences: 1     Standing Expiration Date:   . Comprehensive metabolic panel    Standing Status: Standing     Number of Occurrences: 1     Standing Expiration Date:   . Urinalysis, Routine w reflex microscopic    Standing Status: Standing     Number of Occurrences: 1     Standing Expiration Date:   . Comprehensive metabolic panel    Standing Status: Standing     Number of Occurrences: 1     Standing Expiration Date:   . CBC with Differential/Platelet    Standing Status: Standing     Number of Occurrences: 1     Standing Expiration Date:   . Creatinine, serum    while on enoxaparin therapy    Standing Status: Standing     Number of Occurrences: 2     Standing Expiration Date:   . CBC    Standing Status: Standing     Number of Occurrences: 1     Standing Expiration Date:   . Protime-INR    Standing Status: Standing     Number of Occurrences: 1     Standing Expiration Date:   . APTT    Standing Status: Standing     Number of Occurrences: 1     Standing Expiration Date:   . Basic metabolic panel    Standing Status: Standing     Number of Occurrences: 1     Standing Expiration Date:   . Diet regular    Standing Status: Standing     Number of Occurrences: 1     Standing Expiration Date:     Order Specific Question:  Room service appropriate?    Answer:  Yes      Order Specific Question:  Fluid consistency:    Answer:  Thin  . Insert foley catheter    Standing Status: Standing     Number of Occurrences: 1     Standing Expiration Date:   Timothy Gonzalez Vital signs    Standing Status: Standing     Number of Occurrences: 1     Standing Expiration Date:   . Notify physician    Notify physician for pulse less than 55 or greater than 120, respiratory rate less than 12 or greater than 25, temperature greater than 100.5 F, urinary output less than 30 mL/kg/hr for four hours, systolic BP less than 90 or greater than 102, diastolic BP less than 60 or greater  than 100.    Standing Status: Standing     Number of Occurrences: 20     Standing Expiration Date:   . Up with assistance    Standing Status: Standing     Number of Occurrences: [phone]     Standing Expiration Date:   . apply mouth moisturizer to lips and inside mouth as needed    Standing Status: Standing     Number of Occurrences: 20     Standing Expiration Date:   . Care order/instruction    HOLD LOVENOX DOSE ON 2/15 IN ANTICIPATION OF CT BIOPSY    Standing Status: Standing     Number of Occurrences: 1     Standing Expiration Date:   . Full code    Standing Status: Standing     Number of Occurrences: 1     Standing Expiration Date:   . Consult to neurosurgery    Standing Status: Standing     Number of Occurrences: 1     Standing Expiration Date:     Order Specific Question:  Place call to:    Answer:  unassigned    Order Specific Question:  Reason for Consult    Answer:  Admit  . Consult to dietitian    Standing Status: Standing     Number of Occurrences: 1     Standing Expiration Date:     Order Specific Question:  Reason for consult?    Answer:  Assessment of nutrition requirements/status  . OT eval and treat    Standing Status: Standing     Number of Occurrences: 1     Standing Expiration Date:   . PT eval and treat    Standing Status: Standing     Number of Occurrences: 1      Standing Expiration Date:   . EKG 12-Lead    Standing Status: Standing     Number of Occurrences: 1     Standing Expiration Date:   . Admit to Inpatient (patient's expected length of stay will be greater than 2 midnights)    Standing Status: Standing     Number of Occurrences: 1     Standing Expiration Date:     Order Specific Question:  Hospital Area    Answer:  [place] [[id number]]    Order Specific Question:  Diagnosis    Answer:  Paralysis [[id number]]    Order Specific Question:  Level of Care    Answer:  Med-Surg [16]    Order Specific Question:  Admitting Physician    Answer:  Calton Dach    Order Specific Question:  Estimated length of stay    Answer:  3 - 4 days    Order Specific Question:  Certification:    Answer:  I certify this patient will need inpatient services for at least 2 midnights    Order Specific Question:  Attending Physician    Answer:  Dory Horn [[id number]]    Order Specific Question:  PT Class (Do Not Modify)    Answer:  Inpatient [101]    Order Specific Question:  PT Acc Code (Do Not Modify)    Answer:  Private [1]  . Admit to Inpatient (patient's expected length of stay will be greater than 2 midnights)    Standing Status: Standing     Number of Occurrences: 1     Standing Expiration Date:     Order Specific Question:  Hospital Area    Answer:    [place] [[id number]]    Order Specific Question:  Diagnosis    Answer:  Paralysis [144818]    Order Specific Question:  Level of Care    Answer:  Med-Surg [16]    Order Specific Question:  Admitting Physician    Answer:  Jackelyn Knife [[id number]]    Order Specific Question:  Estimated length of stay    Answer:  past midnight tomorrow    Order Specific Question:  Certification:    Answer:  I certify this patient will need inpatient services for at least 2 midnights    Order Specific Question:  Attending Physician    Answer:  Jackelyn Knife  [[id number]]    Order Specific Question:  PT Class (Do Not Modify)    Answer:  Inpatient [101]    Order Specific Question:  PT Acc Code (Do Not Modify)    Answer:  Private [1]  . Admit to Inpatient (patient's expected length of stay will be greater than 2 midnights)    Standing Status: Standing     Number of Occurrences: 1     Standing Expiration Date:     Order Specific Question:  Hospital Area    Answer:  [facility] [[id number]]    Order Specific Question:  Diagnosis    Answer:  Paraplegia [344.1.ICD-9-CM]    Order Specific Question:  Level of Care    Answer:  Med-Surg [16]    Order Specific Question:  Admitting Physician    Answer:  Rise Patience [phone]    Order Specific Question:  Estimated length of stay    Answer:  past midnight tomorrow    Order Specific Question:  Certification:    Answer:  I certify this patient will need inpatient services for at least 2 midnights    Order Specific Question:  Attending Physician    Answer:  Rise Patience [phone]    Order Specific Question:  PT Class (Do Not Modify)    Answer:  Inpatient [101]    Order Specific Question:  PT Acc Code (Do Not Modify)    Answer:  Private [1]    All questions were answered. The patient knows to call the clinic with any problems, questions or concerns. I spent 40 minutes counseling the patient face to face. The total time spent in the appointment was 60 minutes and more than 50% was on counseling.     Truitt Merle, MD 04/30/2014 8:38 AM

## 2014-04-30 NOTE — Progress Notes (Signed)
Progress Note   Timothy Gonzalez WJX:[id number] DOB: May 18, 1940 DOA: 04/26/2014 PCP: Orpah Melter, MD   Brief Narrative:   Timothy Gonzalez is an 74 y.o. male with a PMH of COPD, and hypertension who has had progressive lower extremity weakness since 04/20/14. CT of the T-spine and L-spine showed a lung mass and a spinal mass invading T8. On-call neurosurgeon Dr. Ronnald Ramp was consulted and at this time Dr. Ronnald Ramp felt that patient was not a candidate for surgery.  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 Ronnald Ramp who did not feel that the patient was a candidate for surgical intervention.  Continue Decadron.  Radiation therapy initiated with plans for 30 gray in 10 fractions from T6-T10.  Staging scans showed a spiculated 2.8 cm pulmonary nodule in the right upper lobe most consistent with primary bronchogenic carcinoma. There are diffuse liver and bone metastasis as well as metastatic lymphadenopathy consistent with stage IV disease.  Unable to have MRI brain due to metal in leg.  IR consulted for CT guided biopsy, which was done 04/29/14. We'll get oncology consultation.  Active Problems:   Severe protein calorie malnutrition  Dietician consulted.  Ensure TID.    COPD (chronic obstructive pulmonary disease)  Currently stable.  Continue Albuterol PRN, Symbicort and Spiriva.      Hypertension  Continue Norvasc.    DVT Prophylaxis  Lovenox ordered.  Code Status: Full. Family Communication: Wife at bedside 04/27/14. Updated daughter Llana Aliment at [phone] at the bedside today.  Other daughter Sigmund Hazel [phone]. Please do not discuss pathology results with patient without one of the daughter's being there per their request, as they do not want him to be alone to hear the results. Disposition Plan: Home versus SNF when diagnosis/treatment plan fully established.  Was getting around with a  wheelchair.     IV Access:    Peripheral IV   Procedures and diagnostic studies:   Ct Thoracic/Lumbar Spine Wo Contrast 04/26/2014: 1. Spiculated right lung mass and destructive T8 spinal mass, most compatible with metastatic lung cancer. Spinal canal invasion at T8, almost certainly with cord compression in this setting. Thoracic MRI (without and with contrast preferred) would evaluate further if necessary. 2. Destructive soft tissue mass right sacral ala involving the right S1 nerve. 3. 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 4. Diffuse spinal compression fractures sparing only the T1 and T3 levels in the thoracolumbar spine.   Dg Chest Port 1 View 04/26/2014: 1. Chronic but progressed tortuosity of the thoracic aorta. 2. Osteopenia with multilevel thoracic compression fractures. Possible left lateral fourth or fifth expansile rib lesion (arrow). 3. Chronic pulmonary hyperinflation, no definite acute pulmonary opacity.     Ct Chest/Abdomen/Pelvis W Contrast 04/27/2014: Spiculated 2.8 cm pulmonary nodule in posterior right upper lobe, consistent with primary bronchogenic carcinoma.  Mild right hilar lymphadenopathy, consistent with metastatic disease.  Diffuse bone metastases, with spinal cord compression again demonstrated at level of T8. See recent thoracic spine CT report.  Diffuse liver metastases.  Left posterior abdominal wall soft tissue metastasis.  Mild lymphadenopathy in the porta hepatis and right external iliac chains, consistent with metastatic disease.     US Biopsy 04/29/2014: Successful ultrasound left paraspinous musculature soft tissue mass 16 gauge core biopsy     Medical Consultants:    IR  Radiation Oncology  Oncology  Anti-Infectives:    None.  Subjective:   Timothy Gonzalez is feeling  well. Had a rough day yesterday with his radiation treatment planning which interrupted his meals and had some back pain related to positioning. Feels okay today  except for some back discomfort. No nausea or vomiting. Appetite is good. Bowels move 2 days ago.  Objective:    Filed Vitals:   04/29/14 1739 04/29/14 1900 04/29/14 2120 04/30/14 0628  BP: 108/72  115/71 118/73  Pulse: 110  120 105  Temp: 98 F (36.7 C)  98.6 F (37 C) 98.4 F (36.9 C)  TempSrc: Oral  Oral Oral  Resp: 18  18 16   Height:      Weight:      SpO2: 100% 99% 100% 99%    Intake/Output Summary (Last 24 hours) at 04/30/14 0820 Last data filed at 04/30/14 4540  Gross per 24 hour  Intake 1460.34 ml  Output    925 ml  Net 535.34 ml    Exam: Gen:  NAD, emaciated/cachectic Cardiovascular:  RRR, No M/R/G Respiratory:  Lungs diminished Gastrointestinal:  Abdomen soft, NT/ND, + BS Extremities:  No C/E/C Neuro: Very little voluntary movement of feet.  Reflexes intact.   Data Reviewed:    Labs: Basic Metabolic Panel:  Recent Labs Lab 04/26/14 1850 04/27/14 0130 04/29/14 0525  NA 137 137 135  K 4.4 4.3 5.0  CL 95* 94* 97  CO2 37* 35* 35*  GLUCOSE 105* 122* 124*  BUN 37* 43* 33*  CREATININE 0.77 0.78 0.52  CALCIUM 9.3 9.0 9.0   GFR Estimated Creatinine Clearance: 66.7 mL/min (by C-G formula based on Cr of 0.52). Liver Function Tests:  Recent Labs Lab 04/26/14 1850 04/27/14 0130  AST 84* 83*  ALT 37 35  ALKPHOS 221* 207*  BILITOT 0.8 0.6  PROT 6.4 6.4  ALBUMIN 2.7* 2.7*   CBC:  Recent Labs Lab 04/26/14 1850 04/27/14 0130 04/29/14 0525  WBC 5.6 6.0 8.5  NEUTROABS 4.3 4.6  --   HGB 10.8* 10.1* 9.1*  HCT 33.2* 30.4* 28.3*  MCV 93.3 93.5 94.6  PLT 230 235 227    Microbiology Recent Results (from the past 240 hour(s))  Urine culture     Status: None   Collection Time: 04/26/14  7:57 PM  Result Value Ref Range Status   Specimen Description URINE, CATHETERIZED  Final   Special Requests NONE  Final   Colony Count NO GROWTH Performed at [facility]   Final   Culture NO GROWTH Performed at [facility]   Final    Report Status 04/28/2014 FINAL  Final     Medications:   . amLODipine  5 mg Oral Daily  . budesonide-formoterol  2 puff Inhalation BID  . calcium-vitamin D  1 tablet Oral Q breakfast  . dexamethasone  4 mg Intravenous 4 times per day  . enoxaparin (LOVENOX) injection  40 mg Subcutaneous Q24H  . feeding supplement (ENSURE COMPLETE)  237 mL Oral TID WC  . multivitamin with minerals  1 tablet Oral Daily  . pneumococcal 23 valent vaccine  0.5 mL Intramuscular Tomorrow-1000  . tiotropium  18 mcg Inhalation Daily   Continuous Infusions: . sodium chloride 50 mL/hr at 04/29/14 1947    Time spent: 25 minutes.   LOS: 4 days   [facility] Pager [phone]. If unable to reach me by pager, please call my cell phone at [phone].  *Please refer to [email or web address], password [email or web address] to get updated schedule on who will round on this patient, as hospitalists switch teams weekly. If 7PM-7AM,  please contact night-coverage at [email or web address], password [email or web address] for any overnight needs.  04/30/2014, 8:20 AM

## 2014-05-01 ENCOUNTER — Ambulatory Visit
Admit: 2014-05-01 | Discharge: 2014-05-01 | Disposition: A | Discharge status: Home / Self Care | Payer: Medicare Other | Attending: Radiation Oncology | Admitting: Radiation Oncology

## 2014-05-01 MED ORDER — HYDROCODONE-ACETAMINOPHEN 5-325 MG PO TABS: 1.0000 | ORAL_TABLET | ORAL | Status: DC | PRN

## 2014-05-01 MED ORDER — KETOROLAC TROMETHAMINE 15 MG/ML IJ SOLN
15.0000 mg | Freq: Once | INTRAMUSCULAR | Status: AC
  Administered 2014-05-01: 15 mg via INTRAVENOUS
  Filled 2014-05-01: qty 1

## 2014-05-01 MED ORDER — SODIUM CHLORIDE 0.9 % IV BOLUS (SEPSIS)
1000.0000 mL | Freq: Once | INTRAVENOUS | Status: AC
  Administered 2014-05-01: 1000 mL via INTRAVENOUS

## 2014-05-01 NOTE — Progress Notes (Signed)
[facility] PROGRESS NOTE Interim History: 74 y.o. male with a PMH of COPD, and hypertension who has had progressive lower extremity weakness since 04/20/14. CT of the T-spine and L-spine showed a lung mass and a spinal mass invading T8. On-call neurosurgeon Dr. Ronnald Ramp was consulted and at this time Dr. Ronnald Ramp felt that patient was not a candidate for surgery. Status post ultrasound-guided left paraspinal mass that showed non-small cell carcinoma. 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 - Patient is on scheduled palliative radiation therapy. The patient will receive 30 Gy in 10 fractions from T6-T10. - With diffuse metastatic disease, unable to do an MRI due to hardware in her lower extremity. - Unrealistic expectations consult palliative care.   Severe protein caloric malnutrition: - Continue ensure 3 times a day. - Continue regular diet.  COPD: - Continue inhalers.    Code Status: Full Family Communication: Wife and daughters Llana Aliment at [phone], daughter Sigmund Hazel [phone]) Disposition Plan: inpatient   Consultants:  IR  Radiation Oncology  Oncology  Procedures: Ct Thoracic/Lumbar Spine Wo Contrast 04/26/2014: 1. Spiculated right lung mass and destructive T8 spinal mass, most compatible with metastatic lung cancer. Spinal canal invasion at T8, almost certainly with cord compression in this setting. Thoracic MRI (without and with contrast preferred) would evaluate further if necessary. 2. Destructive soft tissue mass right sacral ala involving the right S1 nerve. 3. 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 4. Diffuse spinal compression fractures sparing only the T1 and T3 levels in the thoracolumbar  spine.   Dg Chest Port 1 View 04/26/2014: 1. Chronic but progressed tortuosity of the thoracic aorta. 2. Osteopenia with multilevel thoracic compression fractures. Possible left lateral fourth or fifth expansile rib lesion (arrow). 3. Chronic pulmonary hyperinflation, no definite acute pulmonary opacity.   Ct Chest/Abdomen/Pelvis W Contrast 04/27/2014: Spiculated 2.8 cm pulmonary nodule in posterior right upper lobe, consistent with primary bronchogenic carcinoma. Mild right hilar lymphadenopathy, consistent with metastatic disease. Diffuse bone metastases, with spinal cord compression again demonstrated at level of T8. See recent thoracic spine CT report. Diffuse liver metastases. Left posterior abdominal wall soft tissue metastasis. Mild lymphadenopathy in the porta hepatis and right external iliac chains, consistent with metastatic disease.   US Biopsy 04/29/2014: Successful ultrasound left paraspinous musculature soft tissue mass 16 gauge core biopsy   Antibiotics:  None  HPI/Subjective: He relates his pain is controlled, will like to fight this cancer.  Objective: Filed Vitals:   04/30/14 2128 05/01/14 0038 05/01/14 0459 05/01/14 0949  BP: 129/65  121/73 111/87  Pulse: 114 104 99 114  Temp: 97.6 F (36.4 C)  98.1 F (36.7 C)   TempSrc: Oral  Oral   Resp: 18  16   Height:      Weight:      SpO2: 100%  99%     Intake/Output Summary (Last 24 hours) at 05/01/14 1123 Last data filed at 05/01/14 0917  Gross per 24 hour  Intake   1400 ml  Output    726 ml  Net    674 ml   Filed Weights   04/27/14 0105  Weight: 56.5 kg (124 lb 9 oz)    Exam:  General: Alert, awake, oriented x3, in no acute distress. Cachectic appearing HEENT: No bruits, no goiter.  Heart: Regular rate and rhythm. Lungs: Good air movement, clear Abdomen: Soft, nontender, nondistended, positive bowel sounds.  Neuro: Unable to move lower extremities and no sensation.   Data Reviewed: Basic  Metabolic Panel:  Recent Labs Lab 04/26/14 1850 04/27/14 0130 04/29/14 0525  NA 137 137 135  K 4.4 4.3 5.0  CL 95* 94* 97  CO2 37* 35* 35*  GLUCOSE 105* 122* 124*  BUN 37* 43* 33*  CREATININE 0.77 0.78 0.52  CALCIUM 9.3 9.0 9.0   Liver Function Tests:  Recent Labs Lab 04/26/14 1850 04/27/14 0130  AST 84* 83*  ALT 37 35  ALKPHOS 221* 207*  BILITOT 0.8 0.6  PROT 6.4 6.4  ALBUMIN 2.7* 2.7*   No results for input(s): LIPASE, AMYLASE in the last 168 hours. No results for input(s): AMMONIA in the last 168 hours. CBC:  Recent Labs Lab 04/26/14 1850 04/27/14 0130 04/29/14 0525  WBC 5.6 6.0 8.5  NEUTROABS 4.3 4.6  --   HGB 10.8* 10.1* 9.1*  HCT 33.2* 30.4* 28.3*  MCV 93.3 93.5 94.6  PLT 230 235 227   Cardiac Enzymes: No results for input(s): CKTOTAL, CKMB, CKMBINDEX, TROPONINI in the last 168 hours. BNP (last 3 results) No results for input(s): BNP in the last 8760 hours.  ProBNP (last 3 results) No results for input(s): PROBNP in the last 8760 hours.  CBG: No results for input(s): GLUCAP in the last 168 hours.  Recent Results (from the past 240 hour(s))  Urine culture     Status: None   Collection Time: 04/26/14  7:57 PM  Result Value Ref Range Status   Specimen Description URINE, CATHETERIZED  Final   Special Requests NONE  Final   Colony Count NO GROWTH Performed at [facility]   Final   Culture NO GROWTH Performed at [facility]   Final   Report Status 04/28/2014 FINAL  Final     Studies: US Biopsy  04/29/2014   CLINICAL DATA:  RIGHT UPPER LOBE LUNG MASS WITH EVIDENCE OF METASTATIC DISEASE IN THE LIVER, OSSEOUS STRUCTURES, AND SOFT TISSUES. FINDINGS COMPATIBLE WITH METASTATIC LUNG CANCER.  EXAM: ULTRASOUND GUIDED CORE BIOPSY OF LEFT PARASPINOUS MUSCULATURE SOFT TISSUE MASS BIOPSY  MEDICATIONS: 1% LIDOCAINE LOCALLY  PROCEDURE: The procedure, risks, benefits, and alternatives were explained to the patient. Questions regarding the  procedure were encouraged and answered. The patient understands and consents to the procedure.  The left L4 paraspinous musculature was prepped with Betadine in a sterile fashion, and a sterile drape was applied covering the operative field. A sterile gown and sterile gloves were used for the procedure. Local anesthesia was provided with 1% Lidocaine.  Previous imaging reviewed. Patient positioned right side down decubitus. The paraspinous musculature soft tissue mass was localized with ultrasound. Under sterile conditions and local anesthesia, a 16 gauge core biopsy needle was advanced into the lesion. Four core biopsies obtained. Samples placed in formalin. Postprocedure imaging demonstrates no hemorrhage or hematoma. Patient tolerated the biopsy well.  COMPLICATIONS: None.  FINDINGS: Imaging confirms needle placement in the left paraspinous musculature soft tissue mass or core biopsy  IMPRESSION: Successful ultrasound left paraspinous musculature soft tissue mass 16 gauge core biopsy   Electronically Signed   By: Jerilynn Mages.  Shick M.D.   On: 04/29/2014 16:04    Scheduled Meds: . amLODipine  5 mg Oral Daily  . budesonide-formoterol  2 puff Inhalation BID  . calcium-vitamin D  1 tablet Oral Q breakfast  . dexamethasone  4 mg Intravenous  4 times per day  . enoxaparin (LOVENOX) injection  40 mg Subcutaneous Q24H  . feeding supplement (ENSURE COMPLETE)  237 mL Oral TID WC  . ketorolac  30 mg Intravenous Daily  . multivitamin with minerals  1 tablet Oral Daily  . tiotropium  18 mcg Inhalation Daily   Continuous Infusions: . sodium chloride 50 mL/hr at 04/30/14 [place], ABRAHAM  [facility] Pager [phone]. If 8PM-8AM, please contact night-coverage at [email or web address], password [email or web address] 05/01/2014, 11:23 AM  LOS: 5 days

## 2014-05-01 NOTE — Progress Notes (Signed)
PT Cancellation Note  Patient Details Name: Timothy Gonzalez MRN: [id number] DOB: 1940/06/06   Cancelled Treatment:    Reason Eval/Treat Not Completed: Medical issues which prohibited therapy (going to radiation, feels exhausted.)   Claretha Cooper 05/01/2014, 3:03 PM

## 2014-05-01 NOTE — Progress Notes (Signed)
OT Cancellation Note  Patient Details Name: Timothy Gonzalez MRN: [id number] DOB: 1940-06-12   Cancelled Treatment:    Reason Eval/Treat Not Completed: Fatigue/lethargy limiting ability to participate.  Pt too fatiqued this pm.  Dyspnea present at rest and on 02.  Will check back tomorrow.   Timothy Gonzalez 05/01/2014, 3:18 PM  Lesle Chris, OTR/L [phone] 05/01/2014

## 2014-05-02 ENCOUNTER — Ambulatory Visit
Admit: 2014-05-02 | Discharge: 2014-05-02 | Disposition: A | Discharge status: Home / Self Care | Payer: Medicare Other | Attending: Radiation Oncology | Admitting: Radiation Oncology

## 2014-05-02 DIAGNOSIS — Z515 Encounter for palliative care: Secondary | ICD-10-CM | POA: Insufficient documentation

## 2014-05-02 DIAGNOSIS — I151 Hypertension secondary to other renal disorders: Secondary | ICD-10-CM

## 2014-05-02 DIAGNOSIS — G893 Neoplasm related pain (acute) (chronic): Secondary | ICD-10-CM | POA: Insufficient documentation

## 2014-05-02 DIAGNOSIS — N2889 Other specified disorders of kidney and ureter: Secondary | ICD-10-CM

## 2014-05-02 LAB — BASIC METABOLIC PANEL
Anion gap: 6 (ref 5–15)
BUN: 43 mg/dL — ABNORMAL HIGH (ref 6–23)
CO2: 34 mmol/L — AB (ref 19–32)
Calcium: 9 mg/dL (ref 8.4–10.5)
Chloride: 97 mmol/L (ref 96–112)
Creatinine, Ser: 0.52 mg/dL (ref 0.50–1.35)
GFR calc Af Amer: >90 mL/min (ref >90)
GFR calc non Af Amer: >90 mL/min (ref >90)
GLUCOSE: 123 mg/dL — AB (ref 70–99)
POTASSIUM: 5.3 mmol/L — AB (ref 3.5–5.1)
SODIUM: 137 mmol/L (ref 135–145)

## 2014-05-02 MED ORDER — TRAMADOL HCL 50 MG PO TABS
25.0000 mg | ORAL_TABLET | Freq: Four times a day (QID) | ORAL | Status: DC | PRN
  Administered 2014-05-02 – 2014-05-07 (×5): 25 mg via ORAL
  Filled 2014-05-02 (×5): qty 1

## 2014-05-02 MED ORDER — LORAZEPAM 0.5 MG PO TABS
0.5000 mg | ORAL_TABLET | ORAL | Status: DC | PRN
  Administered 2014-05-02 – 2014-05-07 (×5): 0.5 mg via ORAL
  Filled 2014-05-02 (×5): qty 1

## 2014-05-02 NOTE — Evaluation (Addendum)
Physical Therapy Evaluation Patient Details Name: Timothy Gonzalez MRN: [id number] DOB: 1940-05-21 Today's Date: 05/02/2014   History of Present Illness  74 yo male admitted with T8 mets, spinal cord compression, paraplegia, L LE DVT. Hx of COPD-O2 dep, HTN, R THA 2005  Clinical Impression  On eval (co-tx with OT), pt was able to tolerate rolling in bed. Pt required Max assist for bed mobility. Pt did not feel he could tolerate attempt to sit EOB this session. Pt denied increase in pain with mobility during session.    Follow Up Recommendations SNF;Supervision/Assistance - 24 hour (vs home, depending on pt/family's decision)    Equipment Recommendations   Lift equipment-if pt returns home   Recommendations for Other Services OT consult     Precautions / Restrictions Precautions Precautions: Fall Precaution Comments: back precautions for safety Restrictions Weight Bearing Restrictions: No      Mobility  Bed Mobility Overal bed mobility: Needs Assistance Bed Mobility: Rolling Rolling: Max assist         General bed mobility comments: Rolling to L and R x 2. Utilized bedpad to assist. Assist to positio LEs. Pt able to assist a small amount with trunk. Pt did not feel he could tolerate anything beyond rolling.   Transfers                 General transfer comment: not attempted:  recommend lift  Ambulation/Gait                Stairs            Wheelchair Mobility    Modified Rankin (Stroke Patients Only)       Balance                                             Pertinent Vitals/Pain Pain Assessment: No/denies pain    Home Living Family/patient expects to be discharged to:: Unsure Living Arrangements: Spouse/significant other;Children               Additional Comments: pt has a hospital bed and w/c.  Family manually lifted him into bed    Prior Function Level of Independence: Needs assistance   Gait / Transfers  Assistance Needed: Total assist for transfers to/from wheelchair  ADL's / Homemaking Assistance Needed: A for LB ADLs and lifted into w/c by family        Hand Dominance        Extremity/Trunk Assessment   Upper Extremity Assessment: Overall WFL for tasks assessed (Simultaneous filing. User may not have seen previous data.)           Lower Extremity Assessment: RLE deficits/detail;LLE deficits/detail RLE Deficits / Details: Flaccid LLE Deficits / Details: Flaccid     Communication   Communication: No difficulties  Cognition Arousal/Alertness: Awake/alert Behavior During Therapy: WFL for tasks assessed/performed Overall Cognitive Status: Within Functional Limits for tasks assessed                      General Comments      Exercises        Assessment/Plan    PT Assessment Patient needs continued PT services  PT Diagnosis Other (comment) (paraplegia)   PT Problem List Decreased strength;Decreased activity tolerance;Decreased balance;Decreased mobility;Decreased coordination;Decreased knowledge of use of DME;Pain;Impaired sensation;Impaired tone;Decreased skin integrity  PT Treatment Interventions Functional mobility training;Therapeutic activities;Therapeutic exercise;Patient/family education;Wheelchair mobility  training;Balance training   PT Goals (Current goals can be found in the Care Plan section) Acute Rehab PT Goals Patient Stated Goal: none stated:  agreeable to OT/PT PT Goal Formulation: With patient Time For Goal Achievement: 05/16/14 Potential to Achieve Goals: Poor    Frequency Min 2X/week   Barriers to discharge        Co-evaluation   Reason for Co-Treatment: For patient/therapist safety PT goals addressed during session: Mobility/safety with mobility OT goals addressed during session: ADL's and self-care       End of Session   Activity Tolerance: Patient tolerated treatment well Patient left: in bed;with call bell/phone within  reach           Time: 1200-1218 PT Time Calculation (min) (ACUTE ONLY): 18 min   Charges:         PT G Codes:        Weston Anna, MPT Pager: [phone]

## 2014-05-02 NOTE — Progress Notes (Signed)
[facility] Therapy Treatment Record Phone [phone]   Radiation Therapy was administered to [place] on: 05/02/2014  4:02 PM and was treatment # 4 out of a planned course of 10 treatments.

## 2014-05-02 NOTE — Progress Notes (Signed)
OT Cancellation Note  Patient Details Name: Timothy Gonzalez MRN: [id number] DOB: 16-Jun-1940   Cancelled Treatment:    Reason Eval/Treat Not Completed: Other (comment).  MD is with pt and noted XRT is scheduled for 3:00.  Will try to return before this if possible.  Myli Pae 05/02/2014, 11:34 AM  Lesle Chris, OTR/L [phone] 05/02/2014

## 2014-05-02 NOTE — Consult Note (Signed)
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    1.  Code Status: Full  2. GOC: Difficult situation. Anson still has poor insight into disease. Does not remember talking with Oncology.  Difficult family dynamics a challenge as well.  He and daughter [name] states poor relationship with his wife.  Both Lindon Romp and [name] want him to fill out HCPOA paperwork naming daughters surrogate decision makers. I have asked spiritual care to assist.  Philander really is embracing the battle/fight terminology for his cancer.  Repeatedly tells me that he wants to "kick its but". I spoke with [name] about this a little as well. We discussed some of the challenging information regarding prognosis in metastatic NSCLC and addressed that treatment is palliative in nature. His performance status and severe COPD also present challenges with treatment (though potentially nivolumab may be exception, not sure how much data there is with this in poor performance status in [facility]).  Family sensitive to how he will take poor information (don't want to take away hope).  Kim plans on talking more with him about this.  I will continue to follow along and gently address, though will need to be addressed as outpatient in Oncology as well.  While, it certainly seems he wants to be very aggressive in treating his cancer, I think further discussion is needed for him to make informed decisions about this. I am not fully expecting him to change course in his treatment goals, but think he would benefit about hearing more about treatment options and what to expect from treatment.    Family requesting outpatient  Oncology follow-up on a Friday if possible so that daughter could attend appointment.   3. Symptom Management:   1. Cancer Related Pain- reports being sensitive to pain meds. Family/pt wanted Ketorlac stopped yesterday, so I will do so.  He doesn't want to take hydrocodone. Ativan helped before XRT. Most of his pain issues surround this. Will try low dose tramadol. Change ativan to oral.  Will see if we can get by with oral meds to simulate d/c conditions.   4. Psychosocial/piritual: Married 49 yrs. 4 children. Was living at home with 2 sons. Former [employer].    Brief HPI: 74 yo male with PMHx of celiac, COPD who presented with 1 week of lower ext weakness. Admitted 2/13 and found to have RUL lung mass with likely metastatic disease to bone, spine, liver consistent with metastatic lung CA.  He had bx confirming SCC and clinical picture of NSCLC.  Thus far he has met with Oncology (doesn't remember), NUS, XRT.  NUS felt he is not a surgical candidate.  He is currently undergoing palliative XRT.   He told Oncology that he wants to be aggressive in treating his cancer.  He reports feeling much better though still severe weakness of legs and inability to ambulate. Pain has been fairly well controlled and he wants to limit pain meds as much as able.  His biggest issue is around XRT> ativan seemed to actually be quite helpful yesterday. He has had pruritis and hallucinations with morphine so wants this and  actually toradol off his list.  Was taking aleve at home with some success as well. It does seem steroids have helped quite a bit.   Appetite has been improving. Has some abdominal fullness but not much pain. No nausea.  Sleeping well. Admits to fatigue.  Wants to return home after hospitalization.    ROS: Full ROS negative unless otherwise mentioned above.      PMH:  Past Medical History  Diagnosis Date  . COPD (chronic obstructive pulmonary disease)   . Celiac disease   . Asthma     childhood      PSH: Past Surgical History  Procedure Laterality Date  . Total hip arthroplasty  2005    right    I have reviewed the [place] and SH and  If appropriate update it with new information. Allergies  Allergen Reactions  . Barley Grass   . Wheat    Scheduled Meds: . amLODipine  5 mg Oral Daily  . budesonide-formoterol  2 puff Inhalation BID  . calcium-vitamin D  1 tablet Oral Q breakfast  . dexamethasone  4 mg Intravenous 4 times per day  . enoxaparin (LOVENOX) injection  40 mg Subcutaneous Q24H  . feeding supplement (ENSURE COMPLETE)  237 mL Oral TID WC  . multivitamin with minerals  1 tablet Oral Daily  . tiotropium  18 mcg Inhalation Daily   Continuous Infusions:  PRN Meds:.acetaminophen **OR** acetaminophen, albuterol, HYDROcodone-acetaminophen, LORazepam, ondansetron **OR** ondansetron (ZOFRAN) IV, traMADol    BP 117/78 mmHg  Pulse 107  Temp(Src) 97.8 F (36.6 C) (Oral)  Resp 20  Ht $R'6\' 3"'vw$  (1.905 m)  Wt 56.5 kg (124 lb 9 oz)  BMI 15.57 kg/m2  SpO2 99%   PPS: 40   Intake/Output Summary (Last 24 hours) at 05/02/14 1216 Last data filed at 05/02/14 0819  Gross per 24 hour  Intake    680 ml  Output    750 ml  Net    -70 ml    Physical Exam:  General: Alert, NAD HEENT:  [place], sclera anicteric, mmm Neck: thin, sunken clavicles Chest:   Prolonged exp phase, occasional pursed lip breathing CVS: tachy Abdomen: soft, mild distension Ext: no edema Neuro: bilateral lower ext weakness  Labs: CBC    Component Value Date/Time   WBC 8.5 04/29/2014 0525   RBC 2.99* 04/29/2014 0525   HGB 9.1* 04/29/2014 0525   HCT 28.3* 04/29/2014 0525   PLT 227 04/29/2014 0525   MCV 94.6 04/29/2014 0525   MCH 30.4 04/29/2014 0525   MCHC 32.2 04/29/2014 0525   RDW 15.8* 04/29/2014 0525   LYMPHSABS 0.7 04/27/2014 0130   MONOABS 0.8 04/27/2014 0130   EOSABS 0.1 04/27/2014 0130   BASOSABS 0.0 04/27/2014 0130    BMET    Component Value Date/Time   NA 135 04/29/2014 0525   K  5.0 04/29/2014 0525   CL 97 04/29/2014 0525   CO2 35* 04/29/2014 0525   GLUCOSE 124* 04/29/2014 0525   BUN 33* 04/29/2014 0525   CREATININE 0.52 04/29/2014 0525   CALCIUM 9.0 04/29/2014 0525   GFRNONAA >90 04/29/2014 0525   GFRAA >90 04/29/2014 0525    CMP     Component Value Date/Time   NA 135 04/29/2014 0525   K 5.0 04/29/2014 0525   CL 97 04/29/2014 0525   CO2 35* 04/29/2014 0525   GLUCOSE 124* 04/29/2014 0525   BUN 33* 04/29/2014 0525   CREATININE 0.52 04/29/2014 0525   CALCIUM 9.0 04/29/2014 0525  PROT 6.4 04/27/2014 0130   ALBUMIN 2.7* 04/27/2014 0130   AST 83* 04/27/2014 0130   ALT 35 04/27/2014 0130   ALKPHOS 207* 04/27/2014 0130   BILITOT 0.6 04/27/2014 0130   GFRNONAA >90 04/29/2014 0525   GFRAA >90 04/29/2014 0525   2/12 CT L/T Spine IMPRESSION: 1. Spiculated right lung mass and destructive T8 spinal mass, most compatible with metastatic lung cancer. Spinal canal invasion at T8, almost certainly with cord compression in this setting. Thoracic MRI (without and with contrast preferred) would evaluate further if necessary. 2. Destructive soft tissue mass right sacral ala involving the right S1 nerve. 3. 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 4. Diffuse spinal compression fractures sparing only the T1 and T3 levels in the thoracolumbar spine.  2/13 CT Chest IMPRESSION: Spiculated 2.8 cm pulmonary nodule in posterior right upper lobe, consistent with primary bronchogenic carcinoma.  Mild right hilar lymphadenopathy, consistent with metastatic disease.  Diffuse bone metastases, with spinal cord compression again demonstrated at level of T8. See recent thoracic spine CT report.  Diffuse liver metastases.  Left posterior abdominal wall soft tissue metastasis.  Mild lymphadenopathy in the porta hepatis and right external iliac chains, consistent with metastatic disease.   2/13 CT abd/pelvis IMPRESSION: Spiculated 2.8 cm  pulmonary nodule in posterior right upper lobe, consistent with primary bronchogenic carcinoma.  Mild right hilar lymphadenopathy, consistent with metastatic disease.  Diffuse bone metastases, with spinal cord compression again demonstrated at level of T8. See recent thoracic spine CT report.  Diffuse liver metastases.  Left posterior abdominal wall soft tissue metastasis.  Mild lymphadenopathy in the porta hepatis and right external iliac chains, consistent with metastatic disease.   Total Time :90 minutes Greater than 50%  of this time was spent counseling and coordinating care related to the above assessment and plan.  [name] D.O. Palliative Medicine Team at [facility]  Pager: [phone] Team Phone: [phone]

## 2014-05-02 NOTE — Progress Notes (Signed)
[facility] PROGRESS NOTE Interim History: 74 y.o. male with a PMH of COPD, and hypertension who has had progressive lower extremity weakness since 04/20/14. CT of the T-spine and L-spine showed a lung mass and a spinal mass invading T8. On-call neurosurgeon Dr. Ronnald Ramp was consulted and at this time Dr. Ronnald Ramp felt that patient was not a candidate for surgery. Status post ultrasound-guided left paraspinal mass that showed non-small cell carcinoma. 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 - Patient is on scheduled palliative radiation therapy. The patient will receive 30 Gy in 10 fractions from T6-T10. - Unable to do an MRI due to hardware in her lower extremity. - Awaitting  palliative care recommendations. - pain is controlled.   Severe protein caloric malnutrition: - Continue ensure 3 times a day. - Continue regular diet.  COPD: - Continue inhalers.  Code Status: Full Family Communication: Wife and daughters Llana Aliment at [phone], daughter Sigmund Hazel [phone]) Disposition Plan: inpatient   Consultants:  IR  Radiation Oncology  Oncology  Procedures: Ct Thoracic/Lumbar Spine Wo Contrast 04/26/2014: 1. Spiculated right lung mass and destructive T8 spinal mass, most compatible with metastatic lung cancer. Spinal canal invasion at T8, almost certainly with cord compression in this setting. Thoracic MRI (without and with contrast preferred) would evaluate further if necessary. 2. Destructive soft tissue mass right sacral ala involving the right S1 nerve. 3. 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 4. Diffuse spinal compression fractures sparing only the T1 and T3 levels in the thoracolumbar spine.   Dg Chest Port 1 View  04/26/2014: 1. Chronic but progressed tortuosity of the thoracic aorta. 2. Osteopenia with multilevel thoracic compression fractures. Possible left lateral fourth or fifth expansile rib lesion (arrow). 3. Chronic pulmonary hyperinflation, no definite acute pulmonary opacity.   Ct Chest/Abdomen/Pelvis W Contrast 04/27/2014: Spiculated 2.8 cm pulmonary nodule in posterior right upper lobe, consistent with primary bronchogenic carcinoma. Mild right hilar lymphadenopathy, consistent with metastatic disease. Diffuse bone metastases, with spinal cord compression again demonstrated at level of T8. See recent thoracic spine CT report. Diffuse liver metastases. Left posterior abdominal wall soft tissue metastasis. Mild lymphadenopathy in the porta hepatis and right external iliac chains, consistent with metastatic disease.   US Biopsy 04/29/2014: Successful ultrasound left paraspinous musculature soft tissue mass 16 gauge core biopsy   Antibiotics:  None  HPI/Subjective: He relates his pain is controlled.  Objective: Filed Vitals:   05/01/14 2010 05/01/14 2227 05/02/14 0550 05/02/14 1050  BP:  123/71 117/78   Pulse:  117 107   Temp:  98.9 F (37.2 C) 97.8 F (36.6 C)   TempSrc:  Oral Oral   Resp:  18 20   Height:      Weight:      SpO2: 95% 100% 100% 99%    Intake/Output Summary (Last 24 hours) at 05/02/14 1056 Last data filed at 05/02/14 0819  Gross per 24 hour  Intake    920 ml  Output    750 ml  Net    170 ml   Filed Weights   04/27/14 0105  Weight: 56.5 kg (124 lb 9 oz)    Exam:  General: Alert, awake, oriented x3, in no acute distress. Cachectic appearing HEENT: No bruits, no goiter.  Heart: Regular rate and rhythm. Lungs: Good air movement,  clear Abdomen: Soft, nontender, nondistended, positive bowel sounds.  Neuro: Unable to move lower extremities and no sensation.   Data Reviewed: Basic Metabolic Panel:  Recent Labs Lab 04/26/14 1850 04/27/14 0130  04/29/14 0525  NA 137 137 135  K 4.4 4.3 5.0  CL 95* 94* 97  CO2 37* 35* 35*  GLUCOSE 105* 122* 124*  BUN 37* 43* 33*  CREATININE 0.77 0.78 0.52  CALCIUM 9.3 9.0 9.0   Liver Function Tests:  Recent Labs Lab 04/26/14 1850 04/27/14 0130  AST 84* 83*  ALT 37 35  ALKPHOS 221* 207*  BILITOT 0.8 0.6  PROT 6.4 6.4  ALBUMIN 2.7* 2.7*   No results for input(s): LIPASE, AMYLASE in the last 168 hours. No results for input(s): AMMONIA in the last 168 hours. CBC:  Recent Labs Lab 04/26/14 1850 04/27/14 0130 04/29/14 0525  WBC 5.6 6.0 8.5  NEUTROABS 4.3 4.6  --   HGB 10.8* 10.1* 9.1*  HCT 33.2* 30.4* 28.3*  MCV 93.3 93.5 94.6  PLT 230 235 227   Cardiac Enzymes: No results for input(s): CKTOTAL, CKMB, CKMBINDEX, TROPONINI in the last 168 hours. BNP (last 3 results) No results for input(s): BNP in the last 8760 hours.  ProBNP (last 3 results) No results for input(s): PROBNP in the last 8760 hours.  CBG: No results for input(s): GLUCAP in the last 168 hours.  Recent Results (from the past 240 hour(s))  Urine culture     Status: None   Collection Time: 04/26/14  7:57 PM  Result Value Ref Range Status   Specimen Description URINE, CATHETERIZED  Final   Special Requests NONE  Final   Colony Count NO GROWTH Performed at [facility]   Final   Culture NO GROWTH Performed at [facility]   Final   Report Status 04/28/2014 FINAL  Final     Studies: No results found.  Scheduled Meds: . amLODipine  5 mg Oral Daily  . budesonide-formoterol  2 puff Inhalation BID  . calcium-vitamin D  1 tablet Oral Q breakfast  . dexamethasone  4 mg Intravenous 4 times per day  . enoxaparin (LOVENOX) injection  40 mg Subcutaneous Q24H  . feeding supplement (ENSURE COMPLETE)  237 mL Oral TID WC  . ketorolac  30 mg Intravenous Daily  . multivitamin with minerals  1 tablet Oral Daily  . tiotropium  18 mcg Inhalation Daily   Continuous Infusions:     Charlynne Cousins  [facility] Pager [phone]. If 8PM-8AM, please contact night-coverage at [email or web address], password [facility] 05/02/2014, 10:56 AM  LOS: 6 days

## 2014-05-02 NOTE — Evaluation (Signed)
Occupational Therapy Evaluation Patient Details Name: Timothy Gonzalez MRN: [id number] DOB: 07-19-1940 Today's Date: 05/02/2014    History of Present Illness 74 yo male admitted with T8 mets, spinal cord compression, paraplegia, L LE DVT. Hx of COPD-O2 dep, HTN, R THA 2005   Clinical Impression   Pt was admitted for the above.  Pt had decline in function/paralysis on 2/6:  Pt had assist with LB ADLS and was lifted to w/c. Pt is at the same level of function now.  Seen for initial evaluation only.    Follow Up Recommendations  SNF;Supervision/Assistance - 24 hour    Equipment Recommendations   (lift )    Recommendations for Other Services       Precautions / Restrictions Precautions Precautions: Fall Precaution Comments: back precautions for safety Restrictions Weight Bearing Restrictions: No      Mobility Bed Mobility Overal bed mobility: Needs Assistance Bed Mobility: Rolling Rolling: Max assist         General bed mobility comments: Rolling to L and R x 2. Utilized bedpad to assist. Assist to position LEs. Pt able to assist a small amount with trunk. Pt did not feel he could tolerate anything beyond rolling.   Transfers                 General transfer comment: not attempted:  recommend lift    Balance                                            ADL Overall ADL's : At baseline (since recent decline (2/6))  min  A for UB, total A x 2 for LB                                      UE Strength:  WFLs     Vision     Perception     Praxis      Pertinent Vitals/Pain Pain Assessment: No/denies pain     Hand Dominance        Communication Communication Communication: No difficulties   Cognition Arousal/Alertness: Awake/alert Behavior During Therapy: WFL for tasks assessed/performed Overall Cognitive Status: Within Functional Limits for tasks assessed                     General Comments        Exercises       Shoulder Instructions      Home Living Family/patient expects to be discharged to:: Unsure Living Arrangements: Spouse/significant other;Children                               Additional Comments: pt has a hospital bed and w/c.  Family manually lifted him into bed      Prior Functioning/Environment Level of Independence: Needs assistance  Gait / Transfers Assistance Needed: Total assist for transfers to/from wheelchair ADL's / Homemaking Assistance Needed: A for LB ADLs and lifted into w/c by family        OT Diagnosis: Generalized weakness   OT Problem List:     OT Treatment/Interventions:      OT Goals(Current goals can be found in the care plan section) Acute Rehab OT Goals Patient Stated Goal: none stated:  agreeable to OT/PT  OT Frequency:     Barriers to D/C:            Co-evaluation PT/OT/SLP Co-Evaluation/Treatment: Yes Reason for Co-Treatment: For patient/therapist safety PT goals addressed during session: Mobility/safety with mobility OT goals addressed during session: ADL's and self-care      End of Session Nurse Communication:  (recommend PRAFO boots:  blister on R heel)  Activity Tolerance: Patient tolerated treatment well Patient left: in bed;with call bell/phone within reach   Time: 1594-7076 OT Time Calculation (min): 20 min Charges:  OT General Charges $OT Visit: 1 Procedure OT Evaluation $Initial OT Evaluation Tier I: 1 Procedure G-Codes:    Zohra Clavel 2014/05/26, 12:49 PM  Lesle Chris, OTR/L [phone] 05/26/2014

## 2014-05-03 ENCOUNTER — Encounter: Payer: Self-pay | Admitting: Specialist

## 2014-05-03 ENCOUNTER — Ambulatory Visit
Admit: 2014-05-03 | Discharge: 2014-05-03 | Disposition: A | Discharge status: Home / Self Care | Payer: Medicare Other | Attending: Radiation Oncology | Admitting: Radiation Oncology

## 2014-05-03 DIAGNOSIS — C3490 Malignant neoplasm of unspecified part of unspecified bronchus or lung: Secondary | ICD-10-CM

## 2014-05-03 DIAGNOSIS — Z510 Encounter for antineoplastic radiation therapy: Secondary | ICD-10-CM | POA: Diagnosis not present

## 2014-05-03 DIAGNOSIS — G8220 Paraplegia, unspecified: Principal | ICD-10-CM

## 2014-05-03 LAB — BASIC METABOLIC PANEL
ANION GAP: 7 (ref 5–15)
BUN: 45 mg/dL — ABNORMAL HIGH (ref 6–23)
CO2: 32 mmol/L (ref 19–32)
Calcium: 9.1 mg/dL (ref 8.4–10.5)
Chloride: 98 mmol/L (ref 96–112)
Creatinine, Ser: 0.45 mg/dL — ABNORMAL LOW (ref 0.50–1.35)
GFR calc Af Amer: >90 mL/min (ref >90)
GLUCOSE: 105 mg/dL — AB (ref 70–99)
POTASSIUM: 5.4 mmol/L — AB (ref 3.5–5.1)
SODIUM: 137 mmol/L (ref 135–145)

## 2014-05-03 MED ORDER — DEXAMETHASONE 4 MG PO TABS
4.0000 mg | ORAL_TABLET | Freq: Four times a day (QID) | ORAL | Status: DC
  Administered 2014-05-03 – 2014-05-07 (×18): 4 mg via ORAL
  Filled 2014-05-03 (×20): qty 1

## 2014-05-03 MED ORDER — HYDROCODONE-ACETAMINOPHEN 5-325 MG PO TABS: 1.0000 | ORAL_TABLET | ORAL | Status: DC | PRN

## 2014-05-03 NOTE — Progress Notes (Signed)
Patient requested help with completing Advanced Directive paperwork. Came by yesterday afternoon but he had gone to radiation and I was not able to get back. Came by this morning and he was sleeping. He awoke when I came into the room and I introduced myself and told him why I was there. He asked me to come back when his daughter was present. He thought she would return after 10 today. Either I or another chaplain will get back to them after that.   Epifania Gore, PhD, [place]

## 2014-05-03 NOTE — Progress Notes (Signed)
Clinical Social Work  Patient alert and oriented and wants to complete [employer]. CSW notarized HCPOA paperwork for patient. CSW placed copy in chart and provided patient with original documents and copies.   Sindy Messing, LCSW (Coverage for [employer])

## 2014-05-03 NOTE — Progress Notes (Signed)
Clinical Social Work  Patient discussed during progression meeting and team reports that patient and family are refusing SNF placement. CSW is signing off but available if further needs arise.  Sindy Messing, LCSW (Coverage for [employer])

## 2014-05-03 NOTE — Progress Notes (Signed)
[facility] Therapy Treatment Record Phone [phone]   Radiation Therapy was administered to [place] on: 05/03/2014  3:40 PM and was treatment # 5 out of a planned course of 10 treatments.

## 2014-05-03 NOTE — Progress Notes (Signed)
NUTRITION FOLLOW UP  Intervention:   - Continue Ensure Complete po TID, each supplement provides 350 kcal and 13 grams of protein - Will send meats cut up on trays per pt request. - Continue to encourage PO intake.  Nutrition Dx:   Underweight related to metastatic lung cancer as evidenced by BMI of 15.6; ongoing  Goal:   Pt to meet >/= 90% of their estimated nutrition needs; met  Monitor:   Weight trend, po and supplement intake, labs  Assessment:   74 y.o. male with history of COPD, hypertension started experiencing weakness of the lower extremity on February 6. Patient was unable to move his lower extremities and his weakness persisted came to the ER yesterday. CT of the T-spine and L-spine showed a lung mass and a spinal mass invading T8.   2/14: Pt with celiac disease, follows a gluten free diet at home. Pt with no questions at this time about diet. Pt reports feeling very hungry, "like a starving dog".  Pt reports not wanting to eat PTA, d/t feeling scared he was going to fall trying to go to the bathroom.   Pt reports drinking Ensure supplements, states he really likes the strawberry flavor.   Per weight history, pt has lost 21 lb since 2/01 (14% weight loss x 2 weeks, significant for time frame).  2/19: - Per RN, pt with good appetite. He is drinking Ensure- he likes strawberry flavor at room temperature. PO intake 95-100%. - Per pt, he is eating well and drinking Ensure. He says that he loves the food. He did say that the meats sometimes are a little tough for him. Will order cut up meats on trays.  - Pt followed by palliative care.   Pt meets criteria for severe MALNUTRITION in the context of chronic illness as evidenced by severe muscle and fat depletion and 14% weight loss x 2 months.  Height: Ht Readings from Last 1 Encounters:  04/27/14 _0  (1.905 m)    Weight Status:   Wt Readings from Last 1 Encounters:  04/27/14 124 lb 9 oz (56.5 kg)    Re-estimated  needs:  Kcal: 1700-1900 Protein: 80-90 g Fluid: 1.7-1.9 L/day  Skin: intact  Diet Order: Diet regular   Intake/Output Summary (Last 24 hours) at 05/03/14 1148 Last data filed at 05/03/14 1120  Gross per 24 hour  Intake    480 ml  Output   1725 ml  Net  -1245 ml    Last BM: 2/19   Labs:   Recent Labs Lab 04/29/14 0525 05/02/14 1214 05/03/14 0500  NA 135 137 137  K 5.0 5.3* 5.4*  CL 97 97 98  CO2 35* 34* 32  BUN 33* 43* 45*  CREATININE 0.52 0.52 0.45*  CALCIUM 9.0 9.0 9.1  GLUCOSE 124* 123* 105*    CBG (last 3)  No results for input(s): GLUCAP in the last 72 hours.  Scheduled Meds: . amLODipine  5 mg Oral Daily  . budesonide-formoterol  2 puff Inhalation BID  . calcium-vitamin D  1 tablet Oral Q breakfast  . dexamethasone  4 mg Intravenous 4 times per day  . enoxaparin (LOVENOX) injection  40 mg Subcutaneous Q24H  . feeding supplement (ENSURE COMPLETE)  237 mL Oral TID WC  . multivitamin with minerals  1 tablet Oral Daily  . tiotropium  18 mcg Inhalation Daily    Continuous Infusions:   Laurette Schimke MS, [place], LDN [phone]

## 2014-05-03 NOTE — Progress Notes (Signed)
[facility] PROGRESS NOTE Interim History: 74 y.o. male with a PMH of COPD, and hypertension who has had progressive lower extremity weakness since 04/20/14. CT of the T-spine and L-spine showed a lung mass and a spinal mass invading T8. On-call neurosurgeon Dr. Ronnald Ramp was consulted and at this time Dr. Ronnald Ramp felt that patient was not a candidate for surgery. Status post ultrasound-guided left paraspinal mass that showed non-small cell carcinoma. 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 - Patient is on scheduled palliative radiation therapy. - appreciate PMT assistance. - Change Ketoralac to Norco orally. - possible discharge over the weekend.   Severe protein caloric malnutrition: - Continue ensure 3 times a day. - Continue regular diet.  COPD: - Continue inhalers.  Code Status: Full Family Communication: Wife and daughters Llana Aliment at [phone], daughter Sigmund Hazel [phone]) Disposition Plan: inpatient   Consultants:  IR  Radiation Oncology  Oncology  Procedures: Ct Thoracic/Lumbar Spine Wo Contrast 04/26/2014: 1. Spiculated right lung mass and destructive T8 spinal mass, most compatible with metastatic lung cancer. Spinal canal invasion at T8, almost certainly with cord compression in this setting. Thoracic MRI (without and with contrast preferred) would evaluate further if necessary. 2. Destructive soft tissue mass right sacral ala involving the right S1 nerve. 3. 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 4. Diffuse spinal compression fractures sparing only the T1 and T3 levels in the thoracolumbar spine.   Dg Chest Port 1 View 04/26/2014: 1. Chronic but progressed tortuosity of the thoracic aorta. 2.  Osteopenia with multilevel thoracic compression fractures. Possible left lateral fourth or fifth expansile rib lesion (arrow). 3. Chronic pulmonary hyperinflation, no definite acute pulmonary opacity.   Ct Chest/Abdomen/Pelvis W Contrast 04/27/2014: Spiculated 2.8 cm pulmonary nodule in posterior right upper lobe, consistent with primary bronchogenic carcinoma. Mild right hilar lymphadenopathy, consistent with metastatic disease. Diffuse bone metastases, with spinal cord compression again demonstrated at level of T8. See recent thoracic spine CT report. Diffuse liver metastases. Left posterior abdominal wall soft tissue metastasis. Mild lymphadenopathy in the porta hepatis and right external iliac chains, consistent with metastatic disease.   US Biopsy 04/29/2014: Successful ultrasound left paraspinous musculature soft tissue mass 16 gauge core biopsy   Antibiotics:  None  HPI/Subjective: In a good mood. No complians.  Objective: Filed Vitals:   05/02/14 2116 05/02/14 2213 05/03/14 0637 05/03/14 1013  BP:  118/77 112/78 120/76  Pulse:  115 112 107  Temp:  97.9 F (36.6 C) 98.5 F (36.9 C) 97.7 F (36.5 C)  TempSrc:  Oral Oral Oral  Resp:  18 20 22   Height:      Weight:      SpO2: 98% 99% 99% 97%    Intake/Output Summary (Last 24 hours) at 05/03/14 1150 Last data filed at 05/03/14 1120  Gross per 24 hour  Intake    480 ml  Output   1725 ml  Net  -1245 ml   Filed Weights   04/27/14 0105  Weight: 56.5 kg (124 lb 9 oz)    Exam:  General: Alert, awake, oriented x3, in no acute distress. Cachectic appearing HEENT: No bruits, no goiter.  Heart: Regular rate and rhythm. Neuro: Unable to move lower extremities and no sensation.   Data Reviewed: Basic Metabolic Panel:  Recent Labs Lab  04/26/14 1850 04/27/14 0130 04/29/14 0525 05/02/14 1214 05/03/14 0500  NA 137 137 135 137 137  K 4.4 4.3 5.0 5.3* 5.4*  CL 95* 94* 97 97 98  CO2 37* 35* 35* 34* 32  GLUCOSE  105* 122* 124* 123* 105*  BUN 37* 43* 33* 43* 45*  CREATININE 0.77 0.78 0.52 0.52 0.45*  CALCIUM 9.3 9.0 9.0 9.0 9.1   Liver Function Tests:  Recent Labs Lab 04/26/14 1850 04/27/14 0130  AST 84* 83*  ALT 37 35  ALKPHOS 221* 207*  BILITOT 0.8 0.6  PROT 6.4 6.4  ALBUMIN 2.7* 2.7*   No results for input(s): LIPASE, AMYLASE in the last 168 hours. No results for input(s): AMMONIA in the last 168 hours. CBC:  Recent Labs Lab 04/26/14 1850 04/27/14 0130 04/29/14 0525  WBC 5.6 6.0 8.5  NEUTROABS 4.3 4.6  --   HGB 10.8* 10.1* 9.1*  HCT 33.2* 30.4* 28.3*  MCV 93.3 93.5 94.6  PLT 230 235 227   Cardiac Enzymes: No results for input(s): CKTOTAL, CKMB, CKMBINDEX, TROPONINI in the last 168 hours. BNP (last 3 results) No results for input(s): BNP in the last 8760 hours.  ProBNP (last 3 results) No results for input(s): PROBNP in the last 8760 hours.  CBG: No results for input(s): GLUCAP in the last 168 hours.  Recent Results (from the past 240 hour(s))  Urine culture     Status: None   Collection Time: 04/26/14  7:57 PM  Result Value Ref Range Status   Specimen Description URINE, CATHETERIZED  Final   Special Requests NONE  Final   Colony Count NO GROWTH Performed at [facility]   Final   Culture NO GROWTH Performed at [facility]   Final   Report Status 04/28/2014 FINAL  Final     Studies: No results found.  Scheduled Meds: . amLODipine  5 mg Oral Daily  . budesonide-formoterol  2 puff Inhalation BID  . calcium-vitamin D  1 tablet Oral Q breakfast  . dexamethasone  4 mg Intravenous 4 times per day  . enoxaparin (LOVENOX) injection  40 mg Subcutaneous Q24H  . feeding supplement (ENSURE COMPLETE)  237 mL Oral TID WC  . multivitamin with minerals  1 tablet Oral Daily  . tiotropium  18 mcg Inhalation Daily   Continuous Infusions:     Charlynne Cousins  [facility] Pager [phone]. If 8PM-8AM, please contact night-coverage at  [email or web address], password [facility] 05/03/2014, 11:50 AM  LOS: 7 days

## 2014-05-03 NOTE — Progress Notes (Addendum)
Patient UR:KYHCWCB Vinje      DOB: 08-19-1940      JSE:[id number]   Palliative Medicine Team at [facility] Progress Note    Subjective: Pain well controlled yesterday. Feels that tramadol worked well.  Eating well. No n/v. Slept well overnight. Denies anxiety/depression  Filed Vitals:   05/03/14 0637  BP: 112/78  Pulse: 112  Temp: 98.5 F (36.9 C)  Resp: 20   Physical exam: General: Alert, NAD HEENT: [place], sclera anicteric, mmm, temporal wasting Neck: thin, sunken clavicles Chest: Prolonged exp phase,  pursed lip breathing CVS: tachy Abdomen: soft, mild distension Ext: no edema  CBC    Component Value Date/Time   WBC 8.5 04/29/2014 0525   RBC 2.99* 04/29/2014 0525   HGB 9.1* 04/29/2014 0525   HCT 28.3* 04/29/2014 0525   PLT 227 04/29/2014 0525   MCV 94.6 04/29/2014 0525   MCH 30.4 04/29/2014 0525   MCHC 32.2 04/29/2014 0525   RDW 15.8* 04/29/2014 0525   LYMPHSABS 0.7 04/27/2014 0130   MONOABS 0.8 04/27/2014 0130   EOSABS 0.1 04/27/2014 0130   BASOSABS 0.0 04/27/2014 0130    CMP     Component Value Date/Time   NA 137 05/03/2014 0500   K 5.4* 05/03/2014 0500   CL 98 05/03/2014 0500   CO2 32 05/03/2014 0500   GLUCOSE 105* 05/03/2014 0500   BUN 45* 05/03/2014 0500   CREATININE 0.45* 05/03/2014 0500   CALCIUM 9.1 05/03/2014 0500   PROT 6.4 04/27/2014 0130   ALBUMIN 2.7* 04/27/2014 0130   AST 83* 04/27/2014 0130   ALT 35 04/27/2014 0130   ALKPHOS 207* 04/27/2014 0130   BILITOT 0.6 04/27/2014 0130   GFRNONAA >90 05/03/2014 0500   GFRAA >90 05/03/2014 0500      Assessment and plan: 74 yo male with newly diagnosed NSCLC with liver/bone mets complicated by cord compression.   1. Code Status: Full, I have not discussed  2. GOC: See initial consultation.  In speaking with Jaydenn further today, I still do not think he has great insight into his disease and what to expect. He continues to embrace fighting cancer but also is preparing living will  HCPOA paperwork and at least has some acknowledgement that things may not go well.  He wants to "focus on the positive". I did throw out the theoretical question of if things don't go as hoped with potential chemotherapy administration, how would he want Oncologist to handle difficult news in light of his request to stay positive.  He tells me he does want honest information and would not want to be misled.  I would recommend that further discussion takes place with Oncology about realistic expectations with his cancer and what to expect (or not expect from treatment).  It sounds like some of this conversation has occurred, but I would recommend it be re-address by Oncology at least in clinic, prior to consideration of nivolumab.  I still think he would like take any offered treatment, but would be helpful to set goals if we can set expectations more.  I have expressed my concerns related to how he will do over next weeks to months to family and they plan on gently addressing this with Kylle.  I have at least addressed issue with code status to daughter and stating my fears that if we are in that situation, it is almost certainly related to his cancer and likely little that could be offered to help his cancer at that point.   Family  requesting outpatient Oncology follow-up on a Friday if possible so that daughter could attend appointment.   3. Symptom Management:  1. Cancer Related Pain- good control with tramadol, even at XRT.  Would continue current regimen.   Moved Bowels yesterday.   4. Psychosocial/piritual: Married 49 yrs. 4 children. Was living at home with 2 sons. Former [employer].   Total Time: 40 minutes >50% of time spent in counseling and coordination of care regarding above  [name] D.O. Palliative Medicine Team at [facility]  Pager: [phone] Team Phone: [phone]

## 2014-05-03 NOTE — Progress Notes (Signed)
Timothy Gonzalez   DOB:1971/02/27   WJ#:[id number]   AOZ#:[id number]  Subjective: Pt is tolerating radiation well, still paralyzed, has not gotten out of bed yet. He had a good bowel movement this morning. Denies significant pain.   Objective:  Filed Vitals:   05/03/14 1355  BP: 128/78  Pulse: 104  Temp: 98.7 F (37.1 C)  Resp: 20    Body mass index is 15.57 kg/(m^2).  Intake/Output Summary (Last 24 hours) at 05/03/14 1434 Last data filed at 05/03/14 1120  Gross per 24 hour  Intake    480 ml  Output   1725 ml  Net  -1245 ml     Sclerae unicteric  Oropharynx clear  No peripheral adenopathy  Lungs clear -- no rales or rhonchi  Heart regular rate and rhythm  Abdomen benign  MSK no focal spinal tenderness, no peripheral edema  Neuro: He is able to wiggle his right big toe, but otherwise bilateral lower extremity strength 0/5, light touch Sensation Still Intact.   CBG (last 3)  No results for input(s): GLUCAP in the last 72 hours.   Labs:  Lab Results  Component Value Date   WBC 8.5 04/29/2014   HGB 9.1* 04/29/2014   HCT 28.3* 04/29/2014   MCV 94.6 04/29/2014   PLT 227 04/29/2014   NEUTROABS 4.6 04/27/2014    @LASTCHEMISTRY @  Urine Studies No results for input(s): UHGB, CRYS in the last 72 hours.  Invalid input(s): UACOL, UAPR, USPG, UPH, UTP, UGL, UKET, UBIL, UNIT, UROB, [place], UEPI, UWBC, [place], [place], [place], [place], [place]  Basic Metabolic Panel:  Recent Labs Lab 04/26/14 1850 04/27/14 0130 04/29/14 0525 05/02/14 1214 05/03/14 0500  NA 137 137 135 137 137  K 4.4 4.3 5.0 5.3* 5.4*  CL 95* 94* 97 97 98  CO2 37* 35* 35* 34* 32  GLUCOSE 105* 122* 124* 123* 105*  BUN 37* 43* 33* 43* 45*  CREATININE 0.77 0.78 0.52 0.52 0.45*  CALCIUM 9.3 9.0 9.0 9.0 9.1   GFR Estimated Creatinine Clearance: 66.7 mL/min (by C-G formula based on Cr of 0.45). Liver Function Tests:  Recent Labs Lab 04/26/14 1850 04/27/14 0130  AST 84* 83*  ALT 37 35  ALKPHOS 221* 207*   BILITOT 0.8 0.6  PROT 6.4 6.4  ALBUMIN 2.7* 2.7*   No results for input(s): LIPASE, AMYLASE in the last 168 hours. No results for input(s): AMMONIA in the last 168 hours. Coagulation profile  Recent Labs Lab 04/29/14 0525  INR 1.15    CBC:  Recent Labs Lab 04/26/14 1850 04/27/14 0130 04/29/14 0525  WBC 5.6 6.0 8.5  NEUTROABS 4.3 4.6  --   HGB 10.8* 10.1* 9.1*  HCT 33.2* 30.4* 28.3*  MCV 93.3 93.5 94.6  PLT 230 235 227   Cardiac Enzymes: No results for input(s): CKTOTAL, CKMB, CKMBINDEX, TROPONINI in the last 168 hours. BNP: Invalid input(s): POCBNP CBG: No results for input(s): GLUCAP in the last 168 hours. D-Dimer No results for input(s): DDIMER in the last 72 hours. Hgb A1c No results for input(s): HGBA1C in the last 72 hours. Lipid Profile No results for input(s): CHOL, HDL, LDLCALC, TRIG, CHOLHDL, LDLDIRECT in the last 72 hours. Thyroid function studies No results for input(s): TSH, T4TOTAL, T3FREE, THYROIDAB in the last 72 hours.  Invalid input(s): FREET3 Anemia work up No results for input(s): VITAMINB12, FOLATE, FERRITIN, TIBC, IRON, RETICCTPCT in the last 72 hours. Microbiology Recent Results (from the past 240 hour(s))  Urine culture     Status: None  Collection Time: 04/26/14  7:57 PM  Result Value Ref Range Status   Specimen Description URINE, CATHETERIZED  Final   Special Requests NONE  Final   Colony Count NO GROWTH Performed at [facility]   Final   Culture NO GROWTH Performed at [facility]   Final   Report Status 04/28/2014 FINAL  Final   Diagnosis Soft tissue, biopsy, left paraspinous - METASTATIC NON- SMALL CELL CARCINOMA. - SEE DESCRIPTION. Microscopic Comment Immunohistochemistry will be performed and reported as an addendum. The case was discussed with Dr. Burr Medico on 04/30/14. (JDP:ds 04/30/14) Addendum: Immunohistochemistry is performed and the tumor is positive with cytokeratin 5/6, cytokeratin 903 and  cytokeratin 7. The tumor is negative with Napsin A and thyroid transcription factor-1. The immunophenotype and morphology are most consistent with squamous cell carcinoma.   Studies:  No results found.  Assessment:  74 year old gentleman with a 50-pack-year smoking history, COPD on home oxygen, hypertension, presented with worsening bilateral lower extremity weakness, now paralyzed, CT scan showed a 2.8 cm right upper lobe lung mass, right hilar adenopathy, diffuse bone and liver metastasis, T8 cord compression.   1. Metastatic squamous cell carcinoma of lung  -Patient understand this is incurable disease, and the prognosis is extremely poor.  -Giving his extremely poor performance status, he is unlikely to be a candidate for systemic chemotherapy. If his able to improve some after the radiation, I'll see him back in my clinic to consider immunotherapy with nivolumab. -alternatively, hospice would be very appropriate given his extremely poor prognosis. I discussed the goal of care is palliative, to preserve his quad of life and prolong his life. He seems not ready for hospice yet. -I attempted to call his daughter Timothy Gonzalez, and left a message for her to call back.  I will arrange an follow up appointment with me in 2 weeks after his hospital discharge.    2. T8 cord compression with paraplegia  -On palliative radiation and steroids    Truitt Merle, MD 05/03/2014  2:34 PM

## 2014-05-04 LAB — BASIC METABOLIC PANEL
Anion gap: 6 (ref 5–15)
BUN: 40 mg/dL — ABNORMAL HIGH (ref 6–23)
CO2: 33 mmol/L — ABNORMAL HIGH (ref 19–32)
Calcium: 8.8 mg/dL (ref 8.4–10.5)
Chloride: 93 mmol/L — ABNORMAL LOW (ref 96–112)
Creatinine, Ser: 0.57 mg/dL (ref 0.50–1.35)
GFR calc non Af Amer: >90 mL/min (ref >90)
Glucose, Bld: 127 mg/dL — ABNORMAL HIGH (ref 70–99)
Potassium: 4.8 mmol/L (ref 3.5–5.1)
Sodium: 132 mmol/L — ABNORMAL LOW (ref 135–145)

## 2014-05-04 NOTE — Clinical Social Work Placement (Signed)
Clinical Social Work Department CLINICAL SOCIAL WORK PLACEMENT NOTE 05/04/2014  Patient:  Timothy Gonzalez,Timothy Gonzalez  Account Number:  [id number] [place] date:  04/26/2014  Clinical Social Worker:  Dede Query, CLINICAL SOCIAL WORKER  Date/time:  05/04/2014 04:00 PM  Clinical Social Work is seeking post-discharge placement for this patient at the following level of care:   [place]   (*CSW will update this form in Epic as items are completed)   05/04/2014  Patient/family provided with [place] Department of Clinical Social Work's list of facilities offering this level of care within the geographic area requested by the patient (or if unable, by the patient's family).  05/04/2014  Patient/family informed of their freedom to choose among providers that offer the needed level of care, that participate in Medicare, Medicaid or managed care program needed by the patient, have an available bed and are willing to accept the patient.  05/04/2014  Patient/family informed of [facility]' ownership interest in [facility], as well as of the fact that they are under no obligation to receive care at this facility.  PASARR submitted to EDS on 05/04/2014 PASARR number received on 05/04/2014  FL2 transmitted to all facilities in geographic area requested by pt/family on  05/04/2014 FL2 transmitted to all facilities within larger geographic area on   Patient informed that his/her managed care company has contracts with or will negotiate with  certain facilities, including the following:     Patient/family informed of bed offers received:   Patient chooses bed at  Physician recommends and patient chooses bed at    Patient to be transferred to  on   Patient to be transferred to facility by  Patient and family notified of transfer on  Name of family member notified:    The following physician request were entered in Epic:   Additional Comments:  .Dede Query, [place] Social Worker - Weekend Coverage cell #: [phone]

## 2014-05-04 NOTE — Progress Notes (Signed)
Met briefly with Timothy Gonzalez today.  He is frustrated that he may be discharged soon.  Talking with daughter Timothy Gonzalez they think he is more open to SNF now. Both his daughters will bring this up with him this afternoon. He apparently is having some construction done at his house, and is concerned about that.  It sounds like their preferences is shifting from going home to going to SNF until completes XRT and then home.  Looks like Oncology talked with him more about his disease and poor prognosis, but he did not wish to talk about this with me today.  I have already expressed my concerns to his daughter Timothy Gonzalez and she plans on working with him as well.  He has remained very optimistic about potential treatment being offered by Oncology team.  No charge  [name] D.O. Palliative Medicine Team at [facility]  Pager: [phone] Team Phone: [phone]

## 2014-05-04 NOTE — Clinical Social Work Psychosocial (Signed)
Clinical Social Work Department BRIEF PSYCHOSOCIAL ASSESSMENT 05/04/2014  Patient:  Timothy Gonzalez,Timothy Gonzalez     Account Number:  [id number]     [place] date:  04/26/2014  Clinical Social Worker:  Dede Query, CLINICAL SOCIAL WORKER  Date/Time:  05/04/2014 03:40 PM  Referred by:  Physician  Date Referred:  05/04/2014 Referred for  SNF Placement   Other Referral:   Interview type:  Patient Other interview type:   and daughter [name] [phone]    PSYCHOSOCIAL DATA Living Status:  FAMILY Admitted from facility:   Level of care:   Primary support name:  Janene Harvey and Sigmund Hazel Primary support relationship to patient:  CHILD, ADULT Degree of support available:   high/ both daughters are available and supportive    CURRENT CONCERNS  Other Concerns:    SOCIAL WORK ASSESSMENT / PLAN CSW introduced herself and explained her role.  CSW explained rehab/SNF facilities and the possibility of pt discharging to a SNF when ready.  CSW explored pt's history and needs as well as pt's thoughts and feelings around his diagnoses and going home.  CSW provided active listening and obtained pt's daughter's phone number for further assessment and information. CSW also provided information to pt's daughter kim on the phone.   Assessment/plan status:  Psychosocial Support/Ongoing Assessment of Needs Other assessment/ plan:   Information/referral to community resources:   Fax out to SNF facilities and follow up with daughter Sigmund Hazel [phone]    PATIENT'S/FAMILY'S RESPONSE TO PLAN OF CARE: Pt discussed being "tired" from his radiation.  Pt discussed not wanting to leave the hospital because he has 5 more radiations scheduled and it seems "ridiculous" to leave and come back.  Pt stated that he lives at home with his wife and 2 grown sons who do not work.  Pt stated that he is currently having some  construction  at home right now.  Pt asked that CSW "do everything you can to keep me here" in  the hospital so that he can continue his radiation next week.  Pt asked that CSW not contact spouse but to call his daughter Janene Harvey at [phone].  Pt stated that his mood is "on top of the world" and asked that CSW say a prayer for him.  Pt stated that he would "really like to stay" at the hospital to complete his radiation.  Pt's daughter [name] discussed her concerns with pt having to get in and out of the car to go to his radiation and that it "hurts him to get out of cars and automobiles, it wears him out".  Pt's daughter stated that she would like for pt's information to be sent to SNF's but was concerned that he would not be able to leave the SNF when he wanted.  Pt's daughter stated that pt's other daughter Sigmund Hazel [phone] was going to be meeting with pt tonight to discuss SNF options and decisions about discharge.  Pt's daughter stated that it was ok to call either daughter and to please not call pt's spouse.   Dede Query, LCSW [place] Worker - Weekend Coverage cell #: [phone]

## 2014-05-04 NOTE — Progress Notes (Signed)
[facility] PROGRESS NOTE Interim History: 74 y.o. male with a PMH of COPD, and hypertension who has had progressive lower extremity weakness since 04/20/14. CT of the T-spine and L-spine showed a lung mass and a spinal mass invading T8. On-call neurosurgeon Dr. Ronnald Ramp was consulted and at this time Dr. Ronnald Ramp felt that patient was not a candidate for surgery. Status post ultrasound-guided left paraspinal mass that showed non-small cell carcinoma. 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 - Patient is on scheduled palliative radiation therapy. - appreciate PMT assistance.   Severe protein caloric malnutrition: - Continue ensure 3 times a day. - Continue regular diet.  COPD: - Continue inhalers.  Code Status: Full Family Communication: Wife and daughters Llana Aliment at [phone], daughter Sigmund Hazel [phone]) Disposition Plan: home in am   Consultants:  IR  Radiation Oncology  Oncology  Procedures: Ct Thoracic/Lumbar Spine Wo Contrast 04/26/2014: 1. Spiculated right lung mass and destructive T8 spinal mass, most compatible with metastatic lung cancer. Spinal canal invasion at T8, almost certainly with cord compression in this setting. Thoracic MRI (without and with contrast preferred) would evaluate further if necessary. 2. Destructive soft tissue mass right sacral ala involving the right S1 nerve. 3. 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 4. Diffuse spinal compression fractures sparing only the T1 and T3 levels in the thoracolumbar spine.   Dg Chest Port 1 View 04/26/2014: 1. Chronic but progressed tortuosity of the thoracic aorta. 2. Osteopenia with multilevel thoracic compression fractures. Possible left lateral fourth or fifth expansile rib lesion (arrow). 3.  Chronic pulmonary hyperinflation, no definite acute pulmonary opacity.   Ct Chest/Abdomen/Pelvis W Contrast 04/27/2014: Spiculated 2.8 cm pulmonary nodule in posterior right upper lobe, consistent with primary bronchogenic carcinoma. Mild right hilar lymphadenopathy, consistent with metastatic disease. Diffuse bone metastases, with spinal cord compression again demonstrated at level of T8. See recent thoracic spine CT report. Diffuse liver metastases. Left posterior abdominal wall soft tissue metastasis. Mild lymphadenopathy in the porta hepatis and right external iliac chains, consistent with metastatic disease.   US Biopsy 04/29/2014: Successful ultrasound left paraspinous musculature soft tissue mass 16 gauge core biopsy   Antibiotics:  None  HPI/Subjective: Pain well control. Felt asleep during radiation.  Objective: Filed Vitals:   05/03/14 2104 05/03/14 2220 05/04/14 0431 05/04/14 0924  BP:  125/71 132/70   Pulse:  109 101   Temp:  98.1 F (36.7 C) 98.6 F (37 C)   TempSrc:  Oral Oral   Resp:  20 18   Height:      Weight:      SpO2: 98% 97% 99% 98%    Intake/Output Summary (Last 24 hours) at 05/04/14 0940 Last data filed at 05/04/14 0901  Gross per 24 hour  Intake    240 ml  Output   1600 ml  Net  -1360 ml   Filed Weights   04/27/14 0105  Weight: 56.5 kg (124 lb 9 oz)    Exam:  General: Alert, awake, oriented x3, in no acute distress. Cachectic appearing HEENT: No bruits, no goiter.  Heart: Regular rate and rhythm. Neuro: Unable to move lower extremities and no sensation.   Data Reviewed: Basic Metabolic Panel:  Recent Labs Lab 04/29/14 0525 05/02/14 1214 05/03/14 0500 05/04/14 0500  NA 135 137 137 132*  K 5.0 5.3*  5.4* 4.8  CL 97 97 98 93*  CO2 35* 34* 32 33*  GLUCOSE 124* 123* 105* 127*  BUN 33* 43* 45* 40*  CREATININE 0.52 0.52 0.45* 0.57  CALCIUM 9.0 9.0 9.1 8.8   Liver Function Tests: No results for input(s): AST, ALT, ALKPHOS,  BILITOT, PROT, ALBUMIN in the last 168 hours. No results for input(s): LIPASE, AMYLASE in the last 168 hours. No results for input(s): AMMONIA in the last 168 hours. CBC:  Recent Labs Lab 04/29/14 0525  WBC 8.5  HGB 9.1*  HCT 28.3*  MCV 94.6  PLT 227   Cardiac Enzymes: No results for input(s): CKTOTAL, CKMB, CKMBINDEX, TROPONINI in the last 168 hours. BNP (last 3 results) No results for input(s): BNP in the last 8760 hours.  ProBNP (last 3 results) No results for input(s): PROBNP in the last 8760 hours.  CBG: No results for input(s): GLUCAP in the last 168 hours.  Recent Results (from the past 240 hour(s))  Urine culture     Status: None   Collection Time: 04/26/14  7:57 PM  Result Value Ref Range Status   Specimen Description URINE, CATHETERIZED  Final   Special Requests NONE  Final   Colony Count NO GROWTH Performed at [facility]   Final   Culture NO GROWTH Performed at [facility]   Final   Report Status 04/28/2014 FINAL  Final     Studies: No results found.  Scheduled Meds: . amLODipine  5 mg Oral Daily  . budesonide-formoterol  2 puff Inhalation BID  . calcium-vitamin D  1 tablet Oral Q breakfast  . dexamethasone  4 mg Oral 4 times per day  . enoxaparin (LOVENOX) injection  40 mg Subcutaneous Q24H  . feeding supplement (ENSURE COMPLETE)  237 mL Oral TID WC  . multivitamin with minerals  1 tablet Oral Daily  . tiotropium  18 mcg Inhalation Daily   Continuous Infusions:     Charlynne Cousins  [facility] Pager [phone]. If 8PM-8AM, please contact night-coverage at [email or web address], password [facility] 05/04/2014, 9:40 AM  LOS: 8 days

## 2014-05-05 NOTE — Progress Notes (Signed)
Re:  Discharge plans.  Spoke with pt's wife via telephone.  She said she was very concerned about pt coming home too early.  Said they are remodeling pt's bathroom and thought he would be staying until radiation tx's completed.  Spouse said she and pt's sons perform daily care and daughters are "not really involved day to day".  She wants to ensure that daughters are not driving d/c disposition decision.  She would prefer that pt be d/c to home after all radiation tx completed.  Michela Pitcher she will come into hospital Monday morning to speak with MD and discharge planners.

## 2014-05-05 NOTE — Progress Notes (Signed)
[facility] PROGRESS NOTE Interim History: 74 y.o. male with a PMH of COPD, and hypertension who has had progressive lower extremity weakness since 04/20/14. CT of the T-spine and L-spine showed a lung mass and a spinal mass invading T8. On-call neurosurgeon Dr. Ronnald Ramp was consulted and at this time Dr. Ronnald Ramp felt that patient was not a candidate for surgery. Status post ultrasound-guided left paraspinal mass that showed non-small cell carcinoma. 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 - Patient is on scheduled palliative radiation therapy. - appreciate PMT assistance. Pt wants to cont with aggressive treatment. - medical stable to transfer to SNF.   Severe protein caloric malnutrition: - Continue ensure 3 times a day. - Continue regular diet.  COPD: - Continue inhalers.  Code Status: Full Family Communication: Wife and daughters Llana Aliment at [phone], daughter Sigmund Hazel [phone]) Disposition Plan: home in am   Consultants:  IR  Radiation Oncology  Oncology  Procedures: Ct Thoracic/Lumbar Spine Wo Contrast 04/26/2014: 1. Spiculated right lung mass and destructive T8 spinal mass, most compatible with metastatic lung cancer. Spinal canal invasion at T8, almost certainly with cord compression in this setting. Thoracic MRI (without and with contrast preferred) would evaluate further if necessary. 2. Destructive soft tissue mass right sacral ala involving the right S1 nerve. 3. 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 4. Diffuse spinal compression fractures sparing only the T1 and T3 levels in the thoracolumbar spine.   Dg Chest Port 1 View 04/26/2014: 1. Chronic but progressed tortuosity of the thoracic aorta. 2. Osteopenia with multilevel thoracic compression  fractures. Possible left lateral fourth or fifth expansile rib lesion (arrow). 3. Chronic pulmonary hyperinflation, no definite acute pulmonary opacity.   Ct Chest/Abdomen/Pelvis W Contrast 04/27/2014: Spiculated 2.8 cm pulmonary nodule in posterior right upper lobe, consistent with primary bronchogenic carcinoma. Mild right hilar lymphadenopathy, consistent with metastatic disease. Diffuse bone metastases, with spinal cord compression again demonstrated at level of T8. See recent thoracic spine CT report. Diffuse liver metastases. Left posterior abdominal wall soft tissue metastasis. Mild lymphadenopathy in the porta hepatis and right external iliac chains, consistent with metastatic disease.   US Biopsy 04/29/2014: Successful ultrasound left paraspinous musculature soft tissue mass 16 gauge core biopsy   Antibiotics:  None  HPI/Subjective: No complains  Objective: Filed Vitals:   05/04/14 1935 05/04/14 2152 05/05/14 0559 05/05/14 0936  BP:  123/74 117/78   Pulse:  102 106   Temp:  98.3 F (36.8 C) 97.6 F (36.4 C)   TempSrc:  Oral Oral   Resp:  18 18   Height:      Weight:      SpO2: 97% 97% 100% 98%    Intake/Output Summary (Last 24 hours) at 05/05/14 1005 Last data filed at 05/05/14 0552  Gross per 24 hour  Intake   1440 ml  Output   1700 ml  Net   -260 ml   Filed Weights   04/27/14 0105  Weight: 56.5 kg (124 lb 9 oz)    Exam:  General: Alert, awake, oriented x3, in no acute distress. Cachectic appearing HEENT: No bruits, no goiter.  Heart: Regular rate and rhythm. Neuro: Unable to move lower extremities and no sensation.   Data Reviewed: Basic Metabolic Panel:  Recent Labs Lab 04/29/14 0525 05/02/14 1214 05/03/14 0500 05/04/14 0500  NA 135 137 137 132*  K 5.0 5.3* 5.4* 4.8  CL 97 97 98 93*  CO2 35* 34* 32 33*  GLUCOSE 124* 123* 105* 127*  BUN 33* 43* 45* 40*  CREATININE 0.52 0.52 0.45* 0.57  CALCIUM 9.0 9.0 9.1 8.8   Liver Function  Tests: No results for input(s): AST, ALT, ALKPHOS, BILITOT, PROT, ALBUMIN in the last 168 hours. No results for input(s): LIPASE, AMYLASE in the last 168 hours. No results for input(s): AMMONIA in the last 168 hours. CBC:  Recent Labs Lab 04/29/14 0525  WBC 8.5  HGB 9.1*  HCT 28.3*  MCV 94.6  PLT 227   Cardiac Enzymes: No results for input(s): CKTOTAL, CKMB, CKMBINDEX, TROPONINI in the last 168 hours. BNP (last 3 results) No results for input(s): BNP in the last 8760 hours.  ProBNP (last 3 results) No results for input(s): PROBNP in the last 8760 hours.  CBG: No results for input(s): GLUCAP in the last 168 hours.  Recent Results (from the past 240 hour(s))  Urine culture     Status: None   Collection Time: 04/26/14  7:57 PM  Result Value Ref Range Status   Specimen Description URINE, CATHETERIZED  Final   Special Requests NONE  Final   Colony Count NO GROWTH Performed at [facility]   Final   Culture NO GROWTH Performed at [facility]   Final   Report Status 04/28/2014 FINAL  Final     Studies: No results found.  Scheduled Meds: . amLODipine  5 mg Oral Daily  . budesonide-formoterol  2 puff Inhalation BID  . calcium-vitamin D  1 tablet Oral Q breakfast  . dexamethasone  4 mg Oral 4 times per day  . enoxaparin (LOVENOX) injection  40 mg Subcutaneous Q24H  . feeding supplement (ENSURE COMPLETE)  237 mL Oral TID WC  . multivitamin with minerals  1 tablet Oral Daily  . tiotropium  18 mcg Inhalation Daily   Continuous Infusions:     Charlynne Cousins  [facility] Pager [phone]. If 8PM-8AM, please contact night-coverage at [email or web address], password [facility] 05/05/2014, 10:05 AM  LOS: 9 days

## 2014-05-05 NOTE — Progress Notes (Signed)
There is a stage 2 broken blister on pt's sacrum, as well as various areas of broken skin. Applied barrier cream and foam dressing. On right side of pt's heel he has another stage 2 ulcer. Applied another foam dressing there. Will continue to monitor.

## 2014-05-05 NOTE — Clinical Social Work Note (Signed)
CSW met with pt to discuss bed offers.  Pt wanted CSW to call his daughter to provide offers  CSW called and spoke with pt's daughter [name] [phone] and provided bed offers (there were only 2 today)  [place] as a yes otherwise she would discuss current offers with her sister  CSW will follow up on Monday with additional offers and or to obtain a placement decision. Dede Query, LCSW [place] Worker - Weekend Coverage cell #: [phone]

## 2014-05-05 NOTE — Progress Notes (Signed)
Re:  Wife's comments about d/c disposition.  Pt said, "My daughters are taking care of that.  My wife don't know nothing," indicating that he and daughters would be making decision.

## 2014-05-06 ENCOUNTER — Inpatient Hospital Stay (HOSPITAL_COMMUNITY): Payer: Medicare Other

## 2014-05-06 ENCOUNTER — Ambulatory Visit
Admit: 2014-05-06 | Discharge: 2014-05-06 | Disposition: A | Discharge status: Home / Self Care | Payer: Medicare Other | Attending: Radiation Oncology | Admitting: Radiation Oncology

## 2014-05-06 DIAGNOSIS — C7952 Secondary malignant neoplasm of bone marrow: Principal | ICD-10-CM

## 2014-05-06 DIAGNOSIS — C7951 Secondary malignant neoplasm of bone: Secondary | ICD-10-CM

## 2014-05-06 DIAGNOSIS — G893 Neoplasm related pain (acute) (chronic): Secondary | ICD-10-CM

## 2014-05-06 MED ORDER — SODIUM CHLORIDE 0.9 % IV BOLUS (SEPSIS)
1000.0000 mL | Freq: Once | INTRAVENOUS | Status: AC
  Administered 2014-05-06: 1000 mL via INTRAVENOUS

## 2014-05-06 NOTE — Progress Notes (Signed)
Weekly Management Note:  Site: Thoracic spine (T6-T10) Current Dose:  1800  cGy Projected Dose: 3000  cGy  Narrative: The patient is seen today for routine under treatment assessment. CBCT/MVCT images/port films were reviewed. The chart was reviewed.   He is without new complaints today.  He states that he is not having any significant discomfort/pain.  No change in his lower extremity paraplegia.  Physical Examination: There were no vitals filed for this visit..  Weight:  .  Remains paraplegic with absence of sensation to light touch.  Impression: Tolerating radiation therapy well.  Plan: Continue radiation therapy as planned.  He will finish his radiation therapy this Friday.

## 2014-05-06 NOTE — Progress Notes (Signed)
Clinical Social Work  CSW met with patient and dtr Timothy Gonzalez) at bedside. Patient laying in bed and minimally engaged during assessment. CSW provided bed offers for [facility]. Dtr reports they originally wanted something close to home and wanted [facility] but now feel its more important to stay close to the hospital. CSW spoke with admissions at [facility] to determine why they declined to offer a bed and waiting a on return call. Dtr reviewed bed offers and reports she wants to talk with her sister in order to choose a facility. Dtr reports no further questions at this time and thanked CSW for bed offers. CSW will continue to follow.  Sindy Messing, LCSW (Coverage for [employer])

## 2014-05-06 NOTE — Plan of Care (Signed)
Problem: Phase II Progression Outcomes Goal: Obtain order to discontinue catheter if appropriate Outcome: Not Applicable Date Met:  48/59/27 Pt with paralysis

## 2014-05-06 NOTE — Progress Notes (Signed)
[facility] PROGRESS NOTE Interim History: 74 y.o. male with a PMH of COPD, and hypertension who has had progressive lower extremity weakness since 04/20/14. CT of the T-spine and L-spine showed a lung mass and a spinal mass invading T8. On-call neurosurgeon Dr. Ronnald Ramp was consulted and at this time Dr. Ronnald Ramp felt that patient was not a candidate for surgery. Status post ultrasound-guided left paraspinal mass that showed non-small cell carcinoma. Oncology was consulted and radiation oncology recommended radiation from T6-T10.   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 - Patient is on scheduled palliative radiation therapy. - appreciate PMT assistance. Pt wants to cont with aggressive treatment. - awaiting PT recommendations.   Severe protein caloric malnutrition: - Continue ensure 3 times a day. - Tolerating diet.  COPD: - Continue inhalers.  Abd discomfort. - Mildy distended abdomen. hepatomegaly. - having BM daily. - check abd x-ray.  Code Status: Full Family Communication: Wife and daughters Llana Aliment at [phone], daughter Sigmund Hazel [phone]) Disposition Plan: home in am   Consultants:  IR  Radiation Oncology  Oncology  Procedures: Ct Thoracic/Lumbar Spine Wo Contrast 04/26/2014: 1. Spiculated right lung mass and destructive T8 spinal mass, most compatible with metastatic lung cancer. Spinal canal invasion at T8, almost certainly with cord compression in this setting. Thoracic MRI (without and with contrast preferred) would evaluate further if necessary. 2. Destructive soft tissue mass right sacral ala involving the right S1 nerve. 3. 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 4. Diffuse spinal compression fractures sparing only the T1 and T3 levels in the thoracolumbar spine.   Dg Chest Port 1 View 04/26/2014: 1. Chronic but progressed  tortuosity of the thoracic aorta. 2. Osteopenia with multilevel thoracic compression fractures. Possible left lateral fourth or fifth expansile rib lesion (arrow). 3. Chronic pulmonary hyperinflation, no definite acute pulmonary opacity.   Ct Chest/Abdomen/Pelvis W Contrast 04/27/2014: Spiculated 2.8 cm pulmonary nodule in posterior right upper lobe, consistent with primary bronchogenic carcinoma. Mild right hilar lymphadenopathy, consistent with metastatic disease. Diffuse bone metastases, with spinal cord compression again demonstrated at level of T8. See recent thoracic spine CT report. Diffuse liver metastases. Left posterior abdominal wall soft tissue metastasis. Mild lymphadenopathy in the porta hepatis and right external iliac chains, consistent with metastatic disease.   US Biopsy 04/29/2014: Successful ultrasound left paraspinous musculature soft tissue mass 16 gauge core biopsy   Antibiotics:  None  HPI/Subjective: Cannot sit up as his abd bothers him.  Objective: Filed Vitals:   05/05/14 2142 05/06/14 0515 05/06/14 0858 05/06/14 0956  BP: 125/74 133/75  133/75  Pulse: 107 97    Temp: 98.8 F (37.1 C) 97.8 F (36.6 C)    TempSrc: Oral Oral    Resp: 20 18    Height:      Weight:      SpO2: 100% 100% 98%     Intake/Output Summary (Last 24 hours) at 05/06/14 1059 Last data filed at 05/06/14 1000  Gross per 24 hour  Intake    241 ml  Output   1425 ml  Net  -1184 ml   Filed Weights   04/27/14 0105  Weight: 56.5 kg (124 lb 9 oz)    Exam:  General: Alert, awake, oriented x3, in no acute distress. Cachectic appearing HEENT: No bruits, no goiter.  Heart: Regular rate and rhythm. Abd: Distended, not tender, soft except in the epigastric area, mild  hepatomegaly. Neuro: Unable to move lower extremities and no sensation.   Data Reviewed: Basic Metabolic Panel:  Recent Labs Lab 05/02/14 1214 05/03/14 0500 05/04/14 0500  NA 137 137 132*  K 5.3* 5.4* 4.8    CL 97 98 93*  CO2 34* 32 33*  GLUCOSE 123* 105* 127*  BUN 43* 45* 40*  CREATININE 0.52 0.45* 0.57  CALCIUM 9.0 9.1 8.8   Liver Function Tests: No results for input(s): AST, ALT, ALKPHOS, BILITOT, PROT, ALBUMIN in the last 168 hours. No results for input(s): LIPASE, AMYLASE in the last 168 hours. No results for input(s): AMMONIA in the last 168 hours. CBC: No results for input(s): WBC, NEUTROABS, HGB, HCT, MCV, PLT in the last 168 hours. Cardiac Enzymes: No results for input(s): CKTOTAL, CKMB, CKMBINDEX, TROPONINI in the last 168 hours. BNP (last 3 results) No results for input(s): BNP in the last 8760 hours.  ProBNP (last 3 results) No results for input(s): PROBNP in the last 8760 hours.  CBG: No results for input(s): GLUCAP in the last 168 hours.  Recent Results (from the past 240 hour(s))  Urine culture     Status: None   Collection Time: 04/26/14  7:57 PM  Result Value Ref Range Status   Specimen Description URINE, CATHETERIZED  Final   Special Requests NONE  Final   Colony Count NO GROWTH Performed at [facility]   Final   Culture NO GROWTH Performed at [facility]   Final   Report Status 04/28/2014 FINAL  Final     Studies: No results found.  Scheduled Meds: . amLODipine  5 mg Oral Daily  . budesonide-formoterol  2 puff Inhalation BID  . calcium-vitamin D  1 tablet Oral Q breakfast  . dexamethasone  4 mg Oral 4 times per day  . enoxaparin (LOVENOX) injection  40 mg Subcutaneous Q24H  . feeding supplement (ENSURE COMPLETE)  237 mL Oral TID WC  . multivitamin with minerals  1 tablet Oral Daily  . sodium chloride  1,000 mL Intravenous Once  . tiotropium  18 mcg Inhalation Daily   Continuous Infusions:     Charlynne Cousins  [facility] Pager [phone]. If 8PM-8AM, please contact night-coverage at [email or web address], password [facility] 05/06/2014, 10:59 AM  LOS: 10 days

## 2014-05-06 NOTE — Progress Notes (Signed)
Spoke with Timothy Gonzalez this morning. He was concerned about not being weighed recently. Fearful that if his weight does not pick up they may decide to withhold chemotherapy.  I re-assured him that it will be moe related to how he is feeling. I think Timothy Gonzalez is really struggling with coping with his disease. He is aware that he has a terminal cancer and that treatment options are palliative in nature, but hard for him to be anything other than positive. I really have not been able to get him to open up about his fears.  I think he is slowly working through this.   Any attempts I make at talking about his worries/fears going forward (no matter how subtle) are met with positivity about "kicking cancers butt".  At the same time, he has worked towards filling out advanced directives and at least acknowledges that treatment is palliative.  I think it will be difficult for him to have frank discussions about what to do when his cancer worsens, while the hope of chemotherapy remains.  I would like to see him as an outpatient, perhaps in rad/onc clinic. I will see if we can work this out somehow.  Pain currently very well controlled and appetite doing well. Working towards SNF.    [name] D.O. Palliative Medicine Team at [facility]  Pager: [phone] Team Phone: [phone]

## 2014-05-06 NOTE — Progress Notes (Signed)
PT Cancellation Note  Patient Details Name: Timothy Gonzalez MRN: [id number] DOB: 17-May-1940   Cancelled Treatment:    Reason Eval/Treat Not Completed: Pain limiting ability to participate attempted to see pt, he refuses d/t pain in abd;  Explained benefits, offered rolling, UE therapeutic exercise, etc.  Pt continued to decline any activity   [facility] 05/06/2014, 11:52 AM

## 2014-05-07 ENCOUNTER — Ambulatory Visit
Admit: 2014-05-07 | Discharge: 2014-05-07 | Disposition: A | Discharge status: Home / Self Care | Payer: Medicare Other | Attending: Radiation Oncology | Admitting: Radiation Oncology

## 2014-05-07 DIAGNOSIS — J418 Mixed simple and mucopurulent chronic bronchitis: Secondary | ICD-10-CM

## 2014-05-07 MED ORDER — ENSURE COMPLETE PO LIQD: 237.0000 mL | Freq: Three times a day (TID) | ORAL | Status: AC

## 2014-05-07 MED ORDER — DEXAMETHASONE 4 MG PO TABS: 4.0000 mg | ORAL_TABLET | Freq: Four times a day (QID) | ORAL | Status: AC

## 2014-05-07 MED ORDER — POLYETHYLENE GLYCOL 3350 17 G PO PACK
17.0000 g | PACK | Freq: Two times a day (BID) | ORAL | Status: DC
  Administered 2014-05-07: 17 g via ORAL
  Filled 2014-05-07: qty 1

## 2014-05-07 MED ORDER — POLYETHYLENE GLYCOL 3350 17 G PO PACK: 17.0000 g | PACK | Freq: Two times a day (BID) | ORAL | Status: AC

## 2014-05-07 MED ORDER — HYDROCODONE-ACETAMINOPHEN 5-325 MG PO TABS: 1.0000 | ORAL_TABLET | ORAL | Status: AC | PRN

## 2014-05-07 NOTE — Progress Notes (Signed)
Clarified with Dr. Aileen Fass that foley is not to be removed prior to dc to SNF. MD instructed to leave foley in.

## 2014-05-07 NOTE — Progress Notes (Signed)
Report called to [facility]. Fabian November, RN aware of pt status and reason for transfer to facility. Recent medications given and vital signs reported to [facility]. Awaiting arrival of [facility] to transfer pt to facility.

## 2014-05-07 NOTE — Progress Notes (Addendum)
Assessment unchanged. BP checked manually at 138/82 with apical HR @118 . [facility] here to transport to [place]. Packet as prepared by SW given to paramedic to present at facility. Discharged via stretcher to meet ambulance. Accompanied by paramedics x 2. [facility] called and updated report given to Fabian November, RN. Mikki Santee made aware pt on the way to the facility and also of recent VS's as well as recent medications administered to pt prior to leaving.

## 2014-05-07 NOTE — Plan of Care (Signed)
Problem: Phase II Progression Outcomes Goal: Progress activity as tolerated unless otherwise ordered Outcome: Not Progressing Bedridden. Turned q2h.

## 2014-05-07 NOTE — Progress Notes (Signed)
Clinical Social Work Department CLINICAL SOCIAL WORK PLACEMENT NOTE 05/07/2014  Patient:  Timothy Gonzalez,Timothy Gonzalez  Account Number:  [id number] [place] date:  04/26/2014  Clinical Social Worker:  Werner Lean, LCSW  Date/time:  05/07/2014 03:23 PM  Clinical Social Work is seeking post-discharge placement for this patient at the following level of care:   [place]   (*CSW will update this form in Epic as items are completed)   05/04/2014  Patient/family provided with [place] Department of Clinical Social Work's list of facilities offering this level of care within the geographic area requested by the patient (or if unable, by the patient's family).  05/04/2014  Patient/family informed of their freedom to choose among providers that offer the needed level of care, that participate in Medicare, Medicaid or managed care program needed by the patient, have an available bed and are willing to accept the patient.  05/04/2014  Patient/family informed of [facility]' ownership interest in [facility], as well as of the fact that they are under no obligation to receive care at this facility.  PASARR submitted to EDS on 05/04/2014 PASARR number received on 05/04/2014  FL2 transmitted to all facilities in geographic area requested by pt/family on  05/04/2014 FL2 transmitted to all facilities within larger geographic area on   Patient informed that his/her managed care company has contracts with or will negotiate with  certain facilities, including the following:     Patient/family informed of bed offers received:  05/06/2014 Patient chooses bed at [facility] [place] Physician recommends and patient chooses bed at    Patient to be transferred to [place] on  05/07/2014 Patient to be transferred to facility by [facility] Patient and family notified of transfer on 05/07/2014 Name of family member notified:  DAUGHTER  The following physician  request were entered in Epic:   Additional Comments: Pt / daughter are in agreement with d/c to SNF today. [facility] transport required. NSG reviewed d/c summary, scripts, avs. Scripts included in d/c packet. Pt / daughter are aware semi -pvt rm is available at SNF  today with plan for trans to pvt rm on WED.   Werner Lean LCSW [phone]

## 2014-05-07 NOTE — Discharge Summary (Addendum)
Physician Discharge Summary  Timothy Gonzalez RUE:[id number] DOB: 19-Aug-1940 DOA: 04/26/2014  PCP: Orpah Melter, MD  Admit date: 04/26/2014 Discharge date: 05/07/2014  Time spent: 30 minutes  Recommendations for Outpatient Follow-up:  1. Follow up with oncology as an outpatient 2. Will cont radiation as an outpatient. 3. Will go toSNF. 4. Will go Oxygen 4 L nasal canula.  Discharge Diagnoses:  Principal Problem:   Paraplegia Active Problems:   COPD (chronic obstructive pulmonary disease)   Spinal cord mass   Lung mass   Hypertension   Protein-calorie malnutrition, severe   Cord compression   Mass of spinal cord   Metastatic lung cancer (metastasis from lung to other site)   Paraspinal mass   Underweight   Secondary malignant neoplasm of bone and bone marrow   Palliative care encounter   Cancer related pain   Discharge Condition: guarded  Diet recommendation: regular  Filed Weights   04/27/14 0105  Weight: 56.5 kg (124 lb 9 oz)    History of present illness:  74 y.o. male with history of COPD, hypertension started experiencing weakness of the lower extremity on February 6. Patient was unable to move his lower extremities and his weakness persisted came to the ER yesterday. CT of the T-spine and L-spine showed a lung mass and a spinal mass invading T8. On-call neurosurgeon Dr. Ronnald Ramp was consulted and at this time Dr. Ronnald Ramp felt that patient was not a candidate for surgery. Patient on exam is not able to move his lower extremities at all and also has some difficulty in controlling urine. Denies any weakness of upper extremity denies any chest pain or shortness of breath.   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 - He was started on IV Decadron neurosurgery was consulted they recommended no  further surgical intervention. - Oncology recommended radiation therapy there were consulted he will continue his Rx therapy as an outpatient. - His Decadron was changed to oral which she will continue as an outpatient. - Palliative Care was consulted but the patient wanted to continue aggressive treatment. - Patient is on scheduled palliative radiation therapy. - appreciate PMT assistance. Pt wants to cont with aggressive treatment. - PT recommended skilled nursing.  Severe protein caloric malnutrition: - Continue ensure 3 times a day. - Tolerating diet.  COPD: - Continue inhalers.  Abd discomfort. - Abdominal x-ray showed moderate amount of stool burden he was started on MiraLAX twice a day.  Procedures:  Abdominal x-ray  CT of the chest  CT of the abdomen and pelvis.  CT of the thoracic and lumbar spine  Consultations:  Neuro  Oncology  XRT  Discharge Exam: Filed Vitals:   05/07/14 1058  BP: 140/83  Pulse:   Temp:   Resp:     General: Cachectic no acute distress Cardiovascular: Regular rate and rhythm Respiratory: Air movement clear to auscultation  Discharge Instructions   Discharge Instructions    Diet - low sodium heart healthy    Complete by:  As directed      Increase activity slowly    Complete by:  As directed           Current Discharge Medication List    START taking these medications   Details  dexamethasone (DECADRON) 4 MG tablet Take 1 tablet (4 mg total) by mouth every 6 (  six) hours. Qty: 30 tablet, Refills: 0    feeding supplement, ENSURE COMPLETE, (ENSURE COMPLETE) LIQD Take 237 mLs by mouth 3 (three) times daily with meals.    HYDROcodone-acetaminophen (NORCO/VICODIN) 5-325 MG per tablet Take 1-2 tablets by mouth every 4 (four) hours as needed for moderate pain. Qty: 30 tablet, Refills: 0    polyethylene glycol (MIRALAX / GLYCOLAX) packet Take 17 g by mouth 2 (two) times daily. Qty: 14 each, Refills: 0      CONTINUE these  medications which have NOT CHANGED   Details  albuterol (PROVENTIL) (2.5 MG/3ML) 0.083% nebulizer solution Take 2.5 mg by nebulization every 4 (four) hours as needed.      alendronate (FOSAMAX) 70 MG tablet Take 70 mg by mouth once a week. Sunday Once weekly    amLODipine (NORVASC) 5 MG tablet Take 5 mg by mouth daily.      budesonide-formoterol (SYMBICORT) 160-4.5 MCG/ACT inhaler Inhale 2 puffs into the lungs 2 (two) times daily. Qty: 1 Inhaler, Refills: 6    Calcium Carbonate-Vitamin D (CALTRATE 600+D) 600-400 MG-UNIT per tablet Take 1 tablet by mouth daily.     Chelated Magnesium 100 MG TABS Take 1 tablet by mouth daily. 400 mg    Multiple Vitamin (MULTIVITAMIN) tablet Take 1 tablet by mouth daily.      PROAIR HFA 108 (90 BASE) MCG/ACT inhaler INHALE 1 TO 2 PUFFS EVERY 6 HOURS AS NEEDED Qty: 8.5 each, Refills: 6    tiotropium (SPIRIVA HANDIHALER) 18 MCG inhalation capsule PLACE 1 CAPSULE (18 MCG TOTAL) INTO INHALER AND INHALE DAILY. Qty: 30 capsule, Refills: 11    vitamin B-12 (CYANOCOBALAMIN) 1000 MCG tablet Take 1,000 mcg by mouth daily.       STOP taking these medications     naproxen sodium (ANAPROX) 220 MG tablet        Allergies  Allergen Reactions  . Barley Grass   . Wheat       The results of significant diagnostics from this hospitalization (including imaging, microbiology, ancillary and laboratory) are listed below for reference.    Significant Diagnostic Studies: Ct Chest W Contrast  04/27/2014   CLINICAL DATA:  Bone metastases.  Right lung mass.  COPD.  EXAM: CT CHEST, ABDOMEN, AND PELVIS WITH CONTRAST  TECHNIQUE: Multidetector CT imaging of the chest, abdomen and pelvis was performed following the standard protocol during bolus administration of intravenous contrast.  CONTRAST:  14mL OMNIPAQUE IOHEXOL 300 MG/ML  SOLN  COMPARISON:  Chest CT only on 11/10/2005  FINDINGS: CT CHEST FINDINGS  Mediastinum/Lymph Nodes: No mediastinal lymphadenopathy identified.  Mild right hilar lymphadenopathy is seen measuring 13 mm on image 33/series 2.  Lungs/Pleura: Superior pulmonary emphysema. A spiculated mass is seen in the posterior right upper lobe measuring 2.8 x 2.1 cm, consistent with primary bronchogenic carcinoma. No other suspicious masses are seen. 8 mm nodule in the peripheral right lower lobe is stable, consistent with benign etiology. No evidence of pleural effusion.  Musculoskeletal/Soft Tissues: Multiple lytic bone metastases are seen within the thoracic spine and ribs, with spinal cord compression noted at the level of T8, as demonstrated on recent spine CT.  CT ABDOMEN AND PELVIS FINDINGS  Hepatobiliary: Diffuse liver metastases are seen throughout the right and left hepatic lobes. Gallbladder is unremarkable. No evidence of biliary ductal dilatation.  Pancreas: No mass, inflammatory changes, or other parenchymal abnormality identified.  Spleen:  Within normal limits in size and appearance.  Adrenal Glands:  No mass identified.  Kidneys/Urinary Tract: No masses  identified. No evidence of hydronephrosis. Foley catheter seen in bladder, which is decompressed.  Stomach/Bowel/Peritoneum: No evidence of wall thickening, mass, or obstruction.  Vascular/Lymphatic: Mild upper abdominal lymphadenopathy is seen within the porta hepatis. Mild lymphadenopathy is also seen in the pelvis in the right external iliac chain, with largest lymph node measuring 1.8 cm on image 96. Artifact from right hip prosthesis obscures visualization in the lower pelvis.  Reproductive:  No mass or other significant abnormality identified.  Other: Left posterior abdominal wall soft tissue mass is seen in the left paraspinal muscles at the level of L4 measuring 4.6 x 2.8 cm on image 68. This is consistent with abdominal wall metastasis.  Musculoskeletal: Lytic bone metastases are seen involving the right sacrum and right pubis.  IMPRESSION: Spiculated 2.8 cm pulmonary nodule in posterior right upper  lobe, consistent with primary bronchogenic carcinoma.  Mild right hilar lymphadenopathy, consistent with metastatic disease.  Diffuse bone metastases, with spinal cord compression again demonstrated at level of T8. See recent thoracic spine CT report.  Diffuse liver metastases.  Left posterior abdominal wall soft tissue metastasis.  Mild lymphadenopathy in the porta hepatis and right external iliac chains, consistent with metastatic disease.   Electronically Signed   By: Earle Gell M.D.   On: 04/27/2014 15:06   Ct Thoracic Spine Wo Contrast  04/26/2014   CLINICAL DATA:  74 year old who fell out of wheelchair 6 days ago now unable to move bilateral lower extremities and numbness from the waist down. Initial encounter.  EXAM: CT THORACIC AND LUMBAR SPINE WITHOUT CONTRAST  TECHNIQUE: Multidetector CT imaging of the thoracic and lumbar spine was performed without contrast. Multiplanar CT image reconstructions were also generated.  COMPARISON:  Chest radiographs 1928 hr the same day. Chest CTA 11/10/2005.  FINDINGS: CT THORACIC SPINE FINDINGS  Spiculated right upper lobe lung mass measuring 31 mm longus dimension. Severe underlying emphysema. No definite mediastinal or hilar lymphadenopathy.  Diffuse osteopenia with thoracolumbar compression fractures sparing only the T1, and T3 levels.  At the T8 level there is a destructive soft tissue mass which has eroded most of the T8 posterior elements, the right T8 pedicle, and has very likely invaded the thoracic spinal canal. Suspect thoracic spinal cord compression at this level in this setting.  No other destructive osseous lesion identified in the thoracic spine.  CT LUMBAR SPINE FINDINGS  Osteopenia and diffuse lumbar compression fractures.  Superimposed destructive mass of the right sacral ala which has eroded into the right sacral epidural space and likely affects the exiting right S1 nerve root.  Aortoiliac calcified atherosclerosis noted. There is a left paraspinal  muscle mass at the L2-L3 level measuring about 34 mm diameter. Limited evaluation of the abdominal and pelvic viscera, with no acute visceral abnormality is evident.  IMPRESSION: 1. Spiculated right lung mass and destructive T8 spinal mass, most compatible with metastatic lung cancer. Spinal canal invasion at T8, almost certainly with cord compression in this setting. Thoracic MRI (without and with contrast preferred) would evaluate further if necessary. 2. Destructive soft tissue mass right sacral ala involving the right S1 nerve. 3. 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 4. Diffuse spinal compression fractures sparing only the T1 and T3 levels in the thoracolumbar spine. Critical Value/emergent results were called by telephone at the time of interpretation on 04/26/2014 at 9:21 pm to Dr. Quintella Reichert , who verbally acknowledged these results.   Electronically Signed   By: Genevie Ann M.D.   On: 04/26/2014 21:22  Ct Lumbar Spine Wo Contrast  04/26/2014   CLINICAL DATA:  74 year old who fell out of wheelchair 6 days ago now unable to move bilateral lower extremities and numbness from the waist down. Initial encounter.  EXAM: CT THORACIC AND LUMBAR SPINE WITHOUT CONTRAST  TECHNIQUE: Multidetector CT imaging of the thoracic and lumbar spine was performed without contrast. Multiplanar CT image reconstructions were also generated.  COMPARISON:  Chest radiographs 1928 hr the same day. Chest CTA 11/10/2005.  FINDINGS: CT THORACIC SPINE FINDINGS  Spiculated right upper lobe lung mass measuring 31 mm longus dimension. Severe underlying emphysema. No definite mediastinal or hilar lymphadenopathy.  Diffuse osteopenia with thoracolumbar compression fractures sparing only the T1, and T3 levels.  At the T8 level there is a destructive soft tissue mass which has eroded most of the T8 posterior elements, the right T8 pedicle, and has very likely invaded the thoracic spinal canal. Suspect thoracic spinal cord  compression at this level in this setting.  No other destructive osseous lesion identified in the thoracic spine.  CT LUMBAR SPINE FINDINGS  Osteopenia and diffuse lumbar compression fractures.  Superimposed destructive mass of the right sacral ala which has eroded into the right sacral epidural space and likely affects the exiting right S1 nerve root.  Aortoiliac calcified atherosclerosis noted. There is a left paraspinal muscle mass at the L2-L3 level measuring about 34 mm diameter. Limited evaluation of the abdominal and pelvic viscera, with no acute visceral abnormality is evident.  IMPRESSION: 1. Spiculated right lung mass and destructive T8 spinal mass, most compatible with metastatic lung cancer. Spinal canal invasion at T8, almost certainly with cord compression in this setting. Thoracic MRI (without and with contrast preferred) would evaluate further if necessary. 2. Destructive soft tissue mass right sacral ala involving the right S1 nerve. 3. 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 4. Diffuse spinal compression fractures sparing only the T1 and T3 levels in the thoracolumbar spine. Critical Value/emergent results were called by telephone at the time of interpretation on 04/26/2014 at 9:21 pm to Dr. Quintella Reichert , who verbally acknowledged these results.   Electronically Signed   By: Genevie Ann M.D.   On: 04/26/2014 21:22   Ct Abdomen Pelvis W Contrast  04/27/2014   CLINICAL DATA:  Bone metastases.  Right lung mass.  COPD.  EXAM: CT CHEST, ABDOMEN, AND PELVIS WITH CONTRAST  TECHNIQUE: Multidetector CT imaging of the chest, abdomen and pelvis was performed following the standard protocol during bolus administration of intravenous contrast.  CONTRAST:  124mL OMNIPAQUE IOHEXOL 300 MG/ML  SOLN  COMPARISON:  Chest CT only on 11/10/2005  FINDINGS: CT CHEST FINDINGS  Mediastinum/Lymph Nodes: No mediastinal lymphadenopathy identified. Mild right hilar lymphadenopathy is seen measuring 13 mm on image  33/series 2.  Lungs/Pleura: Superior pulmonary emphysema. A spiculated mass is seen in the posterior right upper lobe measuring 2.8 x 2.1 cm, consistent with primary bronchogenic carcinoma. No other suspicious masses are seen. 8 mm nodule in the peripheral right lower lobe is stable, consistent with benign etiology. No evidence of pleural effusion.  Musculoskeletal/Soft Tissues: Multiple lytic bone metastases are seen within the thoracic spine and ribs, with spinal cord compression noted at the level of T8, as demonstrated on recent spine CT.  CT ABDOMEN AND PELVIS FINDINGS  Hepatobiliary: Diffuse liver metastases are seen throughout the right and left hepatic lobes. Gallbladder is unremarkable. No evidence of biliary ductal dilatation.  Pancreas: No mass, inflammatory changes, or other parenchymal abnormality identified.  Spleen:  Within normal limits in size and appearance.  Adrenal Glands:  No mass identified.  Kidneys/Urinary Tract: No masses identified. No evidence of hydronephrosis. Foley catheter seen in bladder, which is decompressed.  Stomach/Bowel/Peritoneum: No evidence of wall thickening, mass, or obstruction.  Vascular/Lymphatic: Mild upper abdominal lymphadenopathy is seen within the porta hepatis. Mild lymphadenopathy is also seen in the pelvis in the right external iliac chain, with largest lymph node measuring 1.8 cm on image 96. Artifact from right hip prosthesis obscures visualization in the lower pelvis.  Reproductive:  No mass or other significant abnormality identified.  Other: Left posterior abdominal wall soft tissue mass is seen in the left paraspinal muscles at the level of L4 measuring 4.6 x 2.8 cm on image 68. This is consistent with abdominal wall metastasis.  Musculoskeletal: Lytic bone metastases are seen involving the right sacrum and right pubis.  IMPRESSION: Spiculated 2.8 cm pulmonary nodule in posterior right upper lobe, consistent with primary bronchogenic carcinoma.  Mild right  hilar lymphadenopathy, consistent with metastatic disease.  Diffuse bone metastases, with spinal cord compression again demonstrated at level of T8. See recent thoracic spine CT report.  Diffuse liver metastases.  Left posterior abdominal wall soft tissue metastasis.  Mild lymphadenopathy in the porta hepatis and right external iliac chains, consistent with metastatic disease.   Electronically Signed   By: Earle Gell M.D.   On: 04/27/2014 15:06   US Biopsy  04/29/2014   CLINICAL DATA:  RIGHT UPPER LOBE LUNG MASS WITH EVIDENCE OF METASTATIC DISEASE IN THE LIVER, OSSEOUS STRUCTURES, AND SOFT TISSUES. FINDINGS COMPATIBLE WITH METASTATIC LUNG CANCER.  EXAM: ULTRASOUND GUIDED CORE BIOPSY OF LEFT PARASPINOUS MUSCULATURE SOFT TISSUE MASS BIOPSY  MEDICATIONS: 1% LIDOCAINE LOCALLY  PROCEDURE: The procedure, risks, benefits, and alternatives were explained to the patient. Questions regarding the procedure were encouraged and answered. The patient understands and consents to the procedure.  The left L4 paraspinous musculature was prepped with Betadine in a sterile fashion, and a sterile drape was applied covering the operative field. A sterile gown and sterile gloves were used for the procedure. Local anesthesia was provided with 1% Lidocaine.  Previous imaging reviewed. Patient positioned right side down decubitus. The paraspinous musculature soft tissue mass was localized with ultrasound. Under sterile conditions and local anesthesia, a 16 gauge core biopsy needle was advanced into the lesion. Four core biopsies obtained. Samples placed in formalin. Postprocedure imaging demonstrates no hemorrhage or hematoma. Patient tolerated the biopsy well.  COMPLICATIONS: None.  FINDINGS: Imaging confirms needle placement in the left paraspinous musculature soft tissue mass or core biopsy  IMPRESSION: Successful ultrasound left paraspinous musculature soft tissue mass 16 gauge core biopsy   Electronically Signed   By: Jerilynn Mages.  Shick M.D.    On: 04/29/2014 16:04   Dg Chest Port 1 View  04/26/2014   CLINICAL DATA:  74 year old male with numbness from the waist down. Wheelchair bound. Initial encounter.  EXAM: PORTABLE CHEST - 1 VIEW  COMPARISON:  06/18/2010.  FINDINGS: Three portable views of the chest. Chronic but progressed tortuosity of the thoracic aorta. Cardiac size and other mediastinal contours remain within normal limits. Chronic pulmonary hyperinflation. Multilevel thoracic compression fractures. Osteopenia.  In the peripheral left upper chest there is a rounded extrapleural type density which may be associated with the lateral fourth or fifth ribs, uncertain.  Otherwise allowing for portable technique the lungs are clear.  IMPRESSION: 1. Chronic but progressed tortuosity of the thoracic aorta. 2. Osteopenia with multilevel thoracic compression fractures. Possible left  lateral fourth or fifth expansile rib lesion (arrow). 3. Chronic pulmonary hyperinflation, no definite acute pulmonary opacity.   Electronically Signed   By: Genevie Ann M.D.   On: 04/26/2014 20:24   Dg Abd Portable 1v  05/06/2014   CLINICAL DATA:  Abdominal pain.  EXAM: PORTABLE ABDOMEN - 1 VIEW  COMPARISON:  CT, 04/27/2014  FINDINGS: Moderate increased stool noted in the colon. There is no bowel dilation to suggest obstruction. No free air.  Bony structures are demineralized. Right hip prosthesis is well aligned.  IMPRESSION: 1. No acute findings. 2. Moderate increased stool in the colon.   Electronically Signed   By: Lajean Manes M.D.   On: 05/06/2014 12:13    Microbiology: No results found for this or any previous visit (from the past 240 hour(s)).   Labs: Basic Metabolic Panel:  Recent Labs Lab 05/02/14 1214 05/03/14 0500 05/04/14 0500  NA 137 137 132*  K 5.3* 5.4* 4.8  CL 97 98 93*  CO2 34* 32 33*  GLUCOSE 123* 105* 127*  BUN 43* 45* 40*  CREATININE 0.52 0.45* 0.57  CALCIUM 9.0 9.1 8.8   Liver Function Tests: No results for input(s): AST, ALT,  ALKPHOS, BILITOT, PROT, ALBUMIN in the last 168 hours. No results for input(s): LIPASE, AMYLASE in the last 168 hours. No results for input(s): AMMONIA in the last 168 hours. CBC: No results for input(s): WBC, NEUTROABS, HGB, HCT, MCV, PLT in the last 168 hours. Cardiac Enzymes: No results for input(s): CKTOTAL, CKMB, CKMBINDEX, TROPONINI in the last 168 hours. BNP: BNP (last 3 results) No results for input(s): BNP in the last 8760 hours.  ProBNP (last 3 results) No results for input(s): PROBNP in the last 8760 hours.  CBG: No results for input(s): GLUCAP in the last 168 hours.     Signed:  Charlynne Cousins  [facility] 05/07/2014, 11:39 AM

## 2014-05-07 NOTE — Care Management Note (Signed)
    Page 1 of 1   05/07/2014     10:58:07 AM CARE MANAGEMENT NOTE 05/07/2014  Patient:  Sweigert,Elion   Account Number:  [id number]  Date Initiated:  05/07/2014  Documentation initiated by:  Sunday Spillers  Subjective/Objective Assessment:   74 yo male admitted with LE paralysis r/t spinal tumor. PTA lived at home with family.     Action/Plan:   Home vs SNF   Anticipated DC Date:  05/07/2014   Anticipated DC Plan:  SKILLED NURSING FACILITY  In-house referral  Clinical Social Worker  Hospice / [place]  CM consult      Choice offered to / List presented to:             Status of service:  Completed, signed off Medicare Important Message given?  YES (If response is "NO", the following Medicare IM given date fields will be blank) Date Medicare IM given:  05/06/2014 Medicare IM given by:  [facility] Date Additional Medicare IM given:   Additional Medicare IM given by:    Discharge Disposition:  [place]  Per UR Regulation:  Reviewed for med. necessity/level of care/duration of stay  If discussed at [place] of Stay Meetings, dates discussed:   05/02/2014  05/07/2014    Comments:

## 2014-05-08 ENCOUNTER — Telehealth: Payer: Self-pay | Admitting: *Deleted

## 2014-05-08 ENCOUNTER — Ambulatory Visit
Admit: 2014-05-08 | Discharge: 2014-05-08 | Disposition: A | Discharge status: Home / Self Care | Payer: Medicare Other | Attending: Radiation Oncology | Admitting: Radiation Oncology

## 2014-05-08 DIAGNOSIS — Z510 Encounter for antineoplastic radiation therapy: Secondary | ICD-10-CM | POA: Diagnosis not present

## 2014-05-08 DIAGNOSIS — C779 Secondary and unspecified malignant neoplasm of lymph node, unspecified: Secondary | ICD-10-CM | POA: Diagnosis not present

## 2014-05-08 DIAGNOSIS — C7951 Secondary malignant neoplasm of bone: Secondary | ICD-10-CM | POA: Diagnosis not present

## 2014-05-08 DIAGNOSIS — I10 Essential (primary) hypertension: Secondary | ICD-10-CM | POA: Diagnosis not present

## 2014-05-08 DIAGNOSIS — C787 Secondary malignant neoplasm of liver and intrahepatic bile duct: Secondary | ICD-10-CM | POA: Diagnosis not present

## 2014-05-08 DIAGNOSIS — Z7901 Long term (current) use of anticoagulants: Secondary | ICD-10-CM | POA: Diagnosis not present

## 2014-05-08 DIAGNOSIS — J449 Chronic obstructive pulmonary disease, unspecified: Secondary | ICD-10-CM | POA: Diagnosis not present

## 2014-05-08 DIAGNOSIS — C3490 Malignant neoplasm of unspecified part of unspecified bronchus or lung: Secondary | ICD-10-CM | POA: Diagnosis not present

## 2014-05-08 DIAGNOSIS — Z7951 Long term (current) use of inhaled steroids: Secondary | ICD-10-CM | POA: Diagnosis not present

## 2014-05-08 DIAGNOSIS — G8220 Paraplegia, unspecified: Secondary | ICD-10-CM | POA: Diagnosis not present

## 2014-05-08 DIAGNOSIS — Z86718 Personal history of other venous thrombosis and embolism: Secondary | ICD-10-CM | POA: Diagnosis not present

## 2014-05-08 NOTE — Telephone Encounter (Signed)
Spoke with [place], Community education officer at [employer], [place] where patient is currently residing. She is aware of his three remaining radiation treatment dates and times. She states he is scheduled to be transported to [facility] for those treatments. Nicole RT, L2 notified and verbalized understanding.

## 2014-05-09 ENCOUNTER — Ambulatory Visit
Admission: RE | Admit: 2014-05-09 | Discharge: 2014-05-09 | Disposition: A | Discharge status: Home / Self Care | Payer: Medicare Other | Source: Ambulatory Visit | Attending: Radiation Oncology | Admitting: Radiation Oncology

## 2014-05-09 DIAGNOSIS — Z510 Encounter for antineoplastic radiation therapy: Secondary | ICD-10-CM | POA: Diagnosis not present

## 2014-05-10 ENCOUNTER — Ambulatory Visit
Admit: 2014-05-10 | Discharge: 2014-05-10 | Disposition: A | Discharge status: Home / Self Care | Payer: Medicare Other | Attending: Radiation Oncology | Admitting: Radiation Oncology

## 2014-05-10 ENCOUNTER — Encounter: Payer: Self-pay | Admitting: Radiation Oncology

## 2014-05-10 DIAGNOSIS — Z510 Encounter for antineoplastic radiation therapy: Secondary | ICD-10-CM | POA: Diagnosis not present

## 2014-05-13 ENCOUNTER — Telehealth: Payer: Self-pay | Admitting: Emergency Medicine

## 2014-05-13 DIAGNOSIS — J449 Chronic obstructive pulmonary disease, unspecified: Secondary | ICD-10-CM

## 2014-05-13 NOTE — Telephone Encounter (Signed)
Please advise if you are okay with [place] sending this order. Thanks.

## 2014-05-13 NOTE — Telephone Encounter (Signed)
He probably already has humidity in his system, but ok with me to confirm that he does, get him for him if he does not have it.

## 2014-05-14 NOTE — Telephone Encounter (Signed)
Spoke with Timothy Gonzalez. States that they have already gotten the humidifier. [facility] advised them they didn't need an order for this. Nothing further was needed.

## 2014-05-19 NOTE — Progress Notes (Addendum)
  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     Plan: The patient has completed radiation treatment. The patient will be discharged to a SNF.  We will contact him to verfiy outpatient followup. I advised him and his family to call or return sooner if they have any questions or concerns related to their recovery or treatment.  -----------------------------------  Eppie Gibson, MD

## 2014-06-01 ENCOUNTER — Other Ambulatory Visit: Payer: Self-pay | Admitting: Adult Health

## 2014-06-12 ENCOUNTER — Telehealth: Payer: Self-pay | Admitting: *Deleted

## 2014-06-12 NOTE — Progress Notes (Signed)
  04/29/14 Weekly Management Note:  Inpatient  Metastatic cancer to spine  Current Dose:  3 Gy  Projected Dose: 30 Gy   Narrative:  The patient presents for routine under treatment assessment.  CBCT/MVCT images/Port film x-rays were reviewed.  The chart was checked. Tolerated first fraction well.  Physical Findings:  vitals were not taken for this visit.  Lying on gurney, NAD  Impression:  The patient is tolerating radiotherapy.  Plan:  Continue radiotherapy as planned.    ________________________________   Eppie Gibson, M.D.

## 2014-06-12 NOTE — Telephone Encounter (Signed)
Spoke with family member.  Patient died May 28, 2014

## 2014-06-14 DEATH — deceased

## 2015-10-05 IMAGING — CT CT T SPINE W/O CM
3 of 11 series · 9 of 33 positions shown, 11 images · non-contrast
Comparison: Chest radiographs 4346 hr the same day. Chest CTA
11/10/2005.

CLINICAL DATA: 73-year-old who fell out of wheelchair 6 days ago
now unable to move bilateral lower extremities and numbness from the
waist down. Initial encounter.

EXAM:
CT THORACIC AND LUMBAR SPINE WITHOUT CONTRAST
TECHNIQUE: Multidetector CT imaging of the thoracic and lumbar spine was
performed without contrast. Multiplanar CT image reconstructions
were also generated.

[Series 4: t-spine 2.0 i30s 3 · axial · 0.32mm/px · z∈[+710,+1060]mm · 5 of 232 slices shown, 7 images]
[im 29/232  soft-tissue]
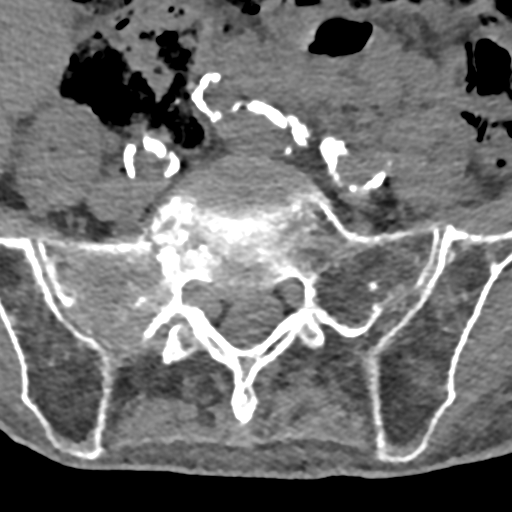
[im 29/232  bone]
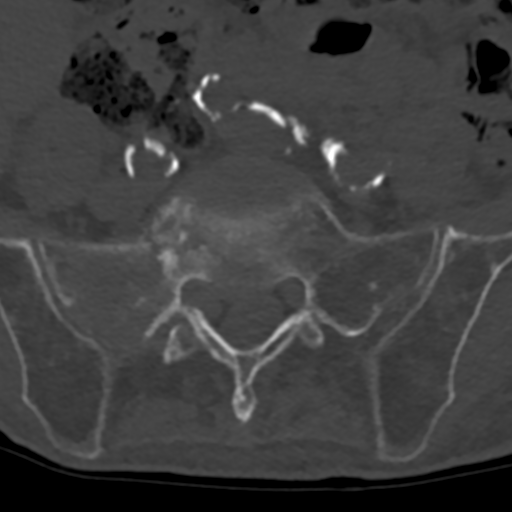
[im 87/232  bone]
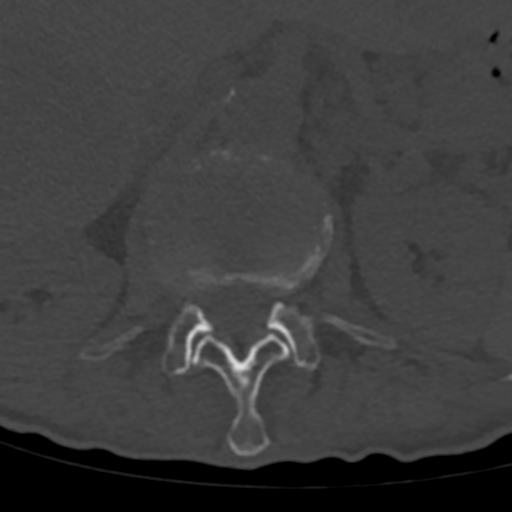
[im 116/232  bone]
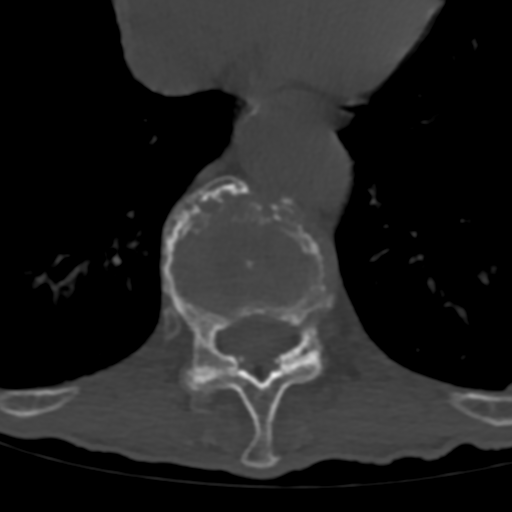
[im 145/232  bone]
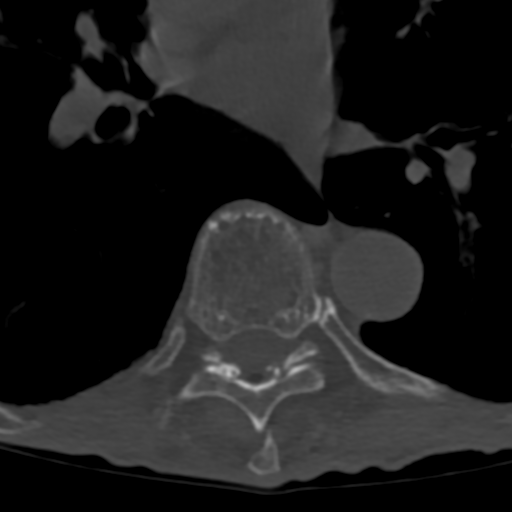
[im 203/232  soft-tissue]
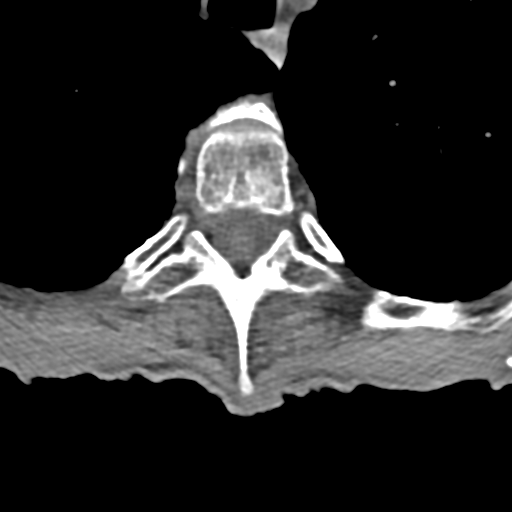
[im 203/232  bone]
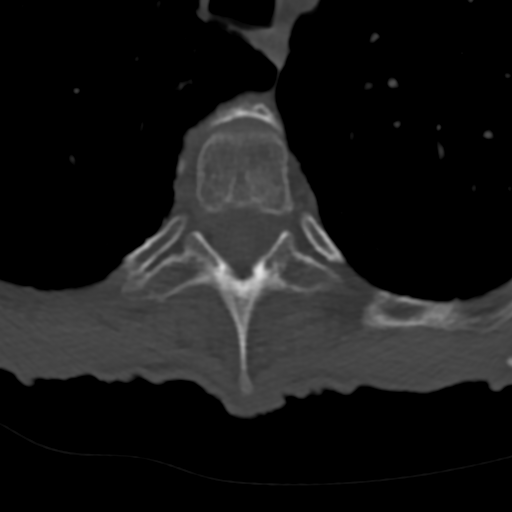

[Series 5: coronal bone · coronal · 0.34mm/px · 1 of 72 slices shown]
[im 36/72  bone]
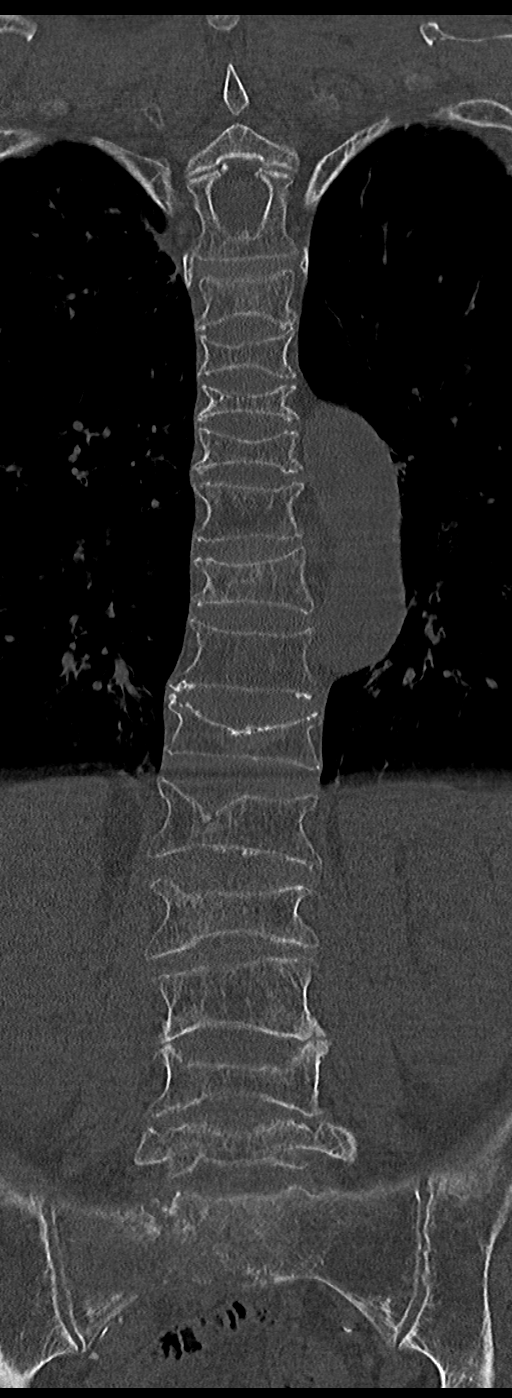

[Series 6: sagittal bone · sagittal · 0.30mm/px · 3 of 75 slices shown]
[im 19/75  bone]
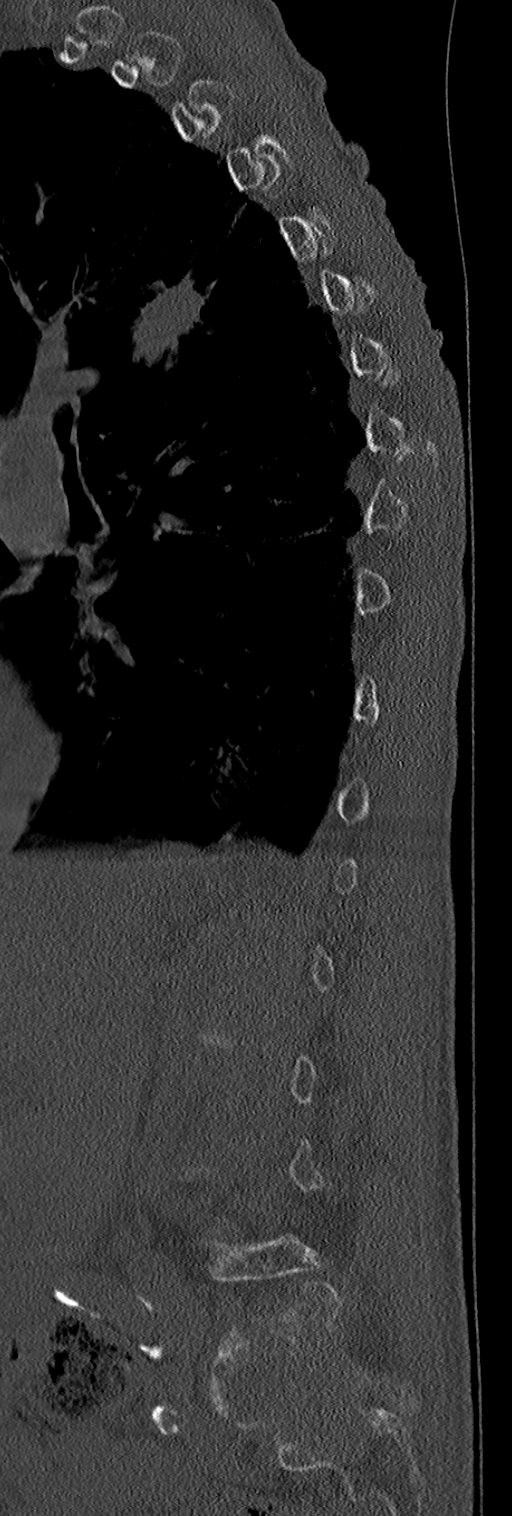
[im 38/75  bone]
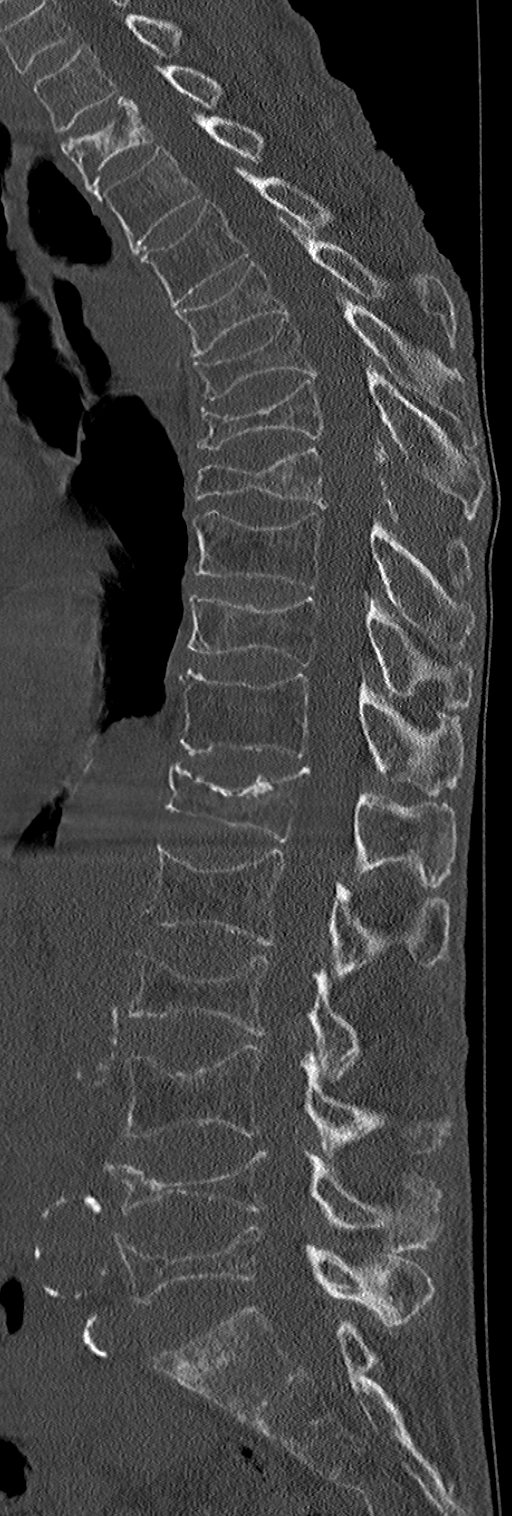
[im 56/75  bone]
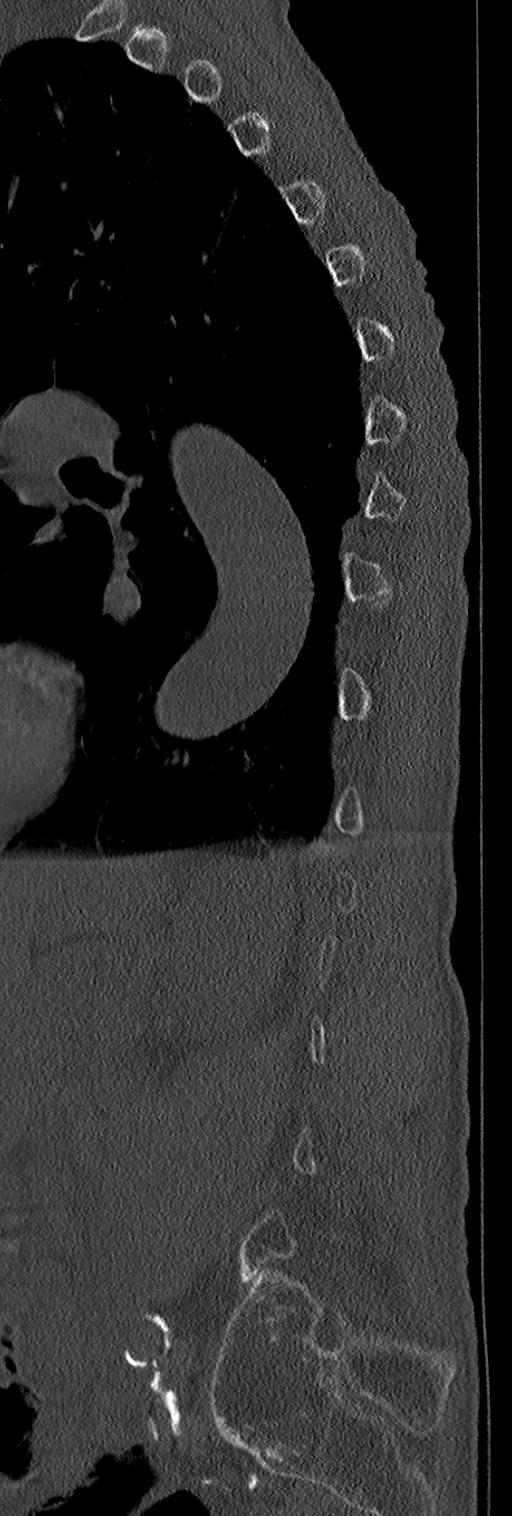

[9 of 33 positions shown; findings below may reference images not displayed]

FINDINGS: CT THORACIC SPINE FINDINGS

Spiculated right upper lobe lung mass measuring 31 mm longus
dimension. Severe underlying emphysema. No definite mediastinal or
hilar lymphadenopathy.

Diffuse osteopenia with thoracolumbar compression fractures sparing
only the T1, and T3 levels.

At the T8 level there is a destructive soft tissue mass which has
eroded most of the T8 posterior elements, the right T8 pedicle, and
has very likely invaded the thoracic spinal canal. Suspect thoracic
spinal cord compression at this level in this setting.

No other destructive osseous lesion identified in the thoracic
spine.

CT LUMBAR SPINE FINDINGS

Osteopenia and diffuse lumbar compression fractures.

Superimposed destructive mass of the right sacral ala which has
eroded into the right sacral epidural space and likely affects the
exiting right S1 nerve root.

Aortoiliac calcified atherosclerosis noted. There is a left
paraspinal muscle mass at the L2-L3 level measuring about 34 mm
diameter. Limited evaluation of the abdominal and pelvic viscera,
with no acute visceral abnormality is evident.
IMPRESSION: 1. Spiculated right lung mass and destructive T8 spinal mass, most
compatible with metastatic lung cancer. Spinal canal invasion at T8,
almost certainly with cord compression in this setting. Thoracic MRI
(without and with contrast preferred) would evaluate further if
necessary.
2. Destructive soft tissue mass right sacral ala involving the right
S1 nerve.
3. Left paraspinal muscle metastasis at the L2-L3 level.
4. Diffuse spinal compression fractures sparing only the T1 and T3
levels in the thoracolumbar spine.
Critical Value/emergent results were called by telephone at the time
of interpretation on 04/26/2014 at [DATE] to Dr. BEPPINO BROSIO ,
who verbally acknowledged these results.

## 2015-10-15 IMAGING — DX DG ABD PORTABLE 1V
2 series · 2 of 2 positions shown · non-contrast
Comparison: CT, 04/27/2014

CLINICAL DATA: Abdominal pain.

EXAM:
PORTABLE ABDOMEN - 1 VIEW

[abdomen kub]
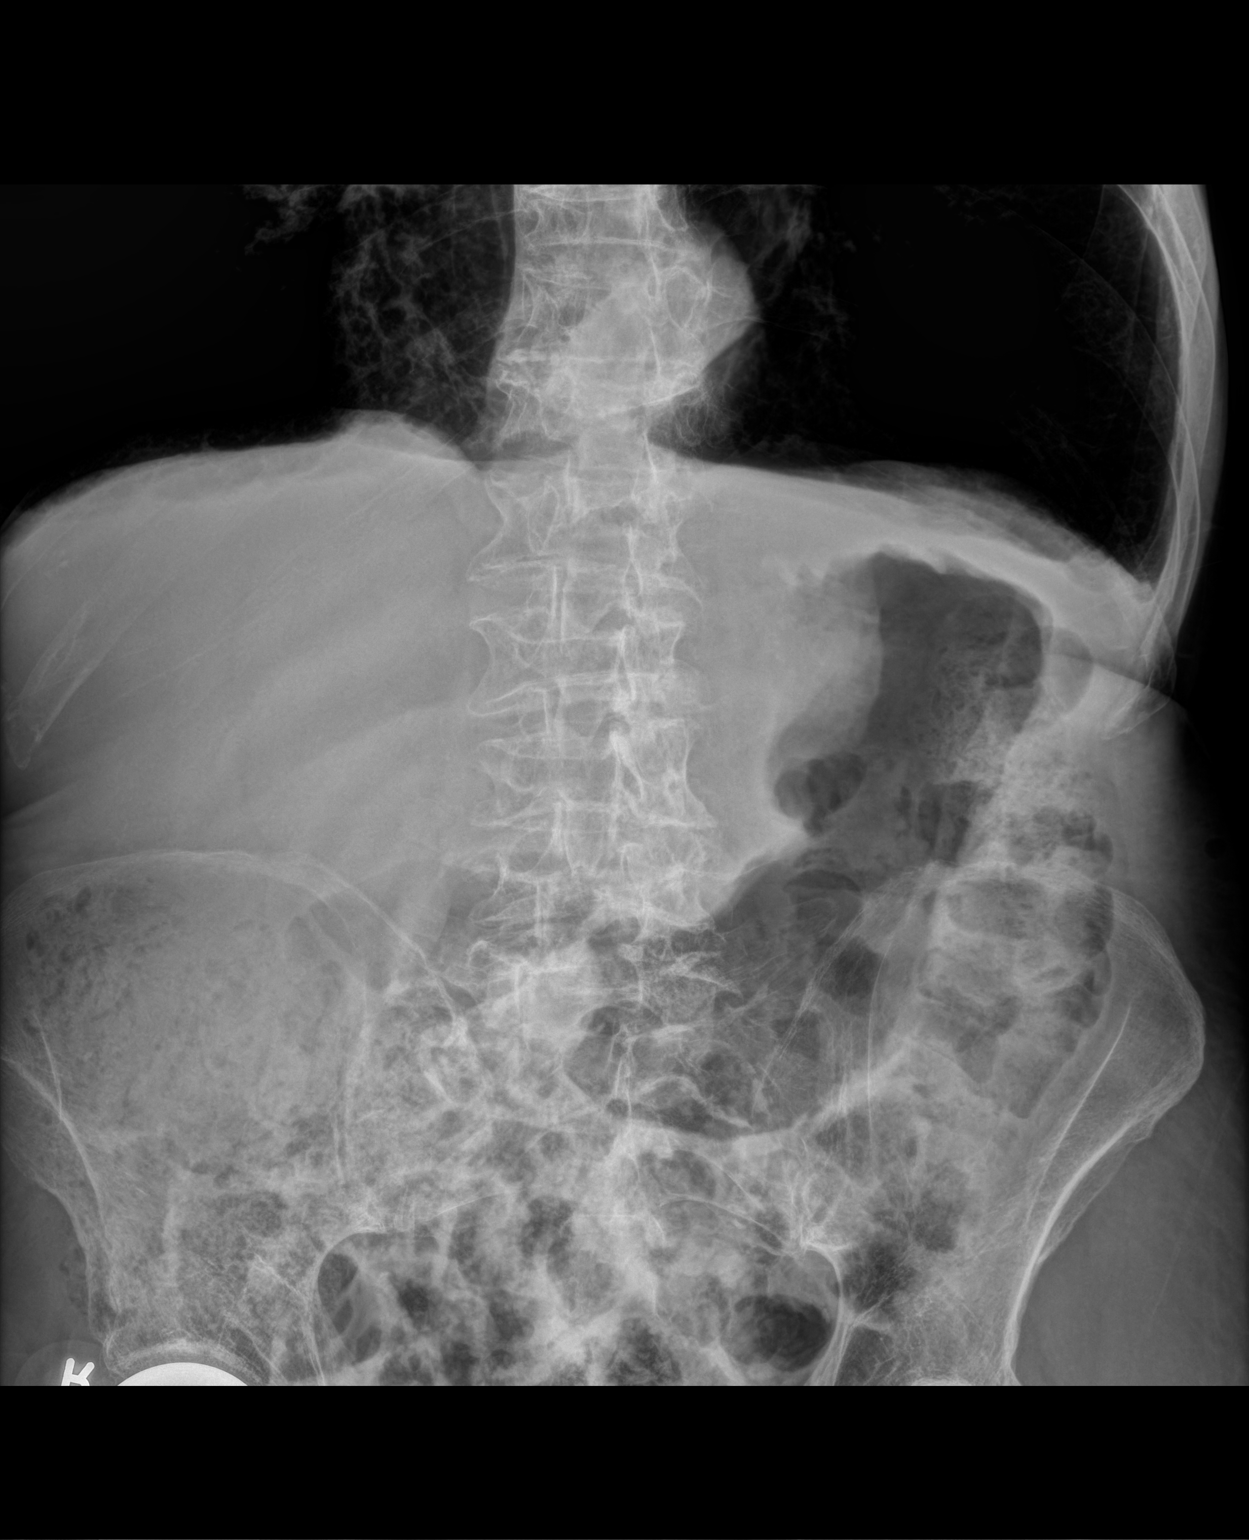

[abdomen decu]
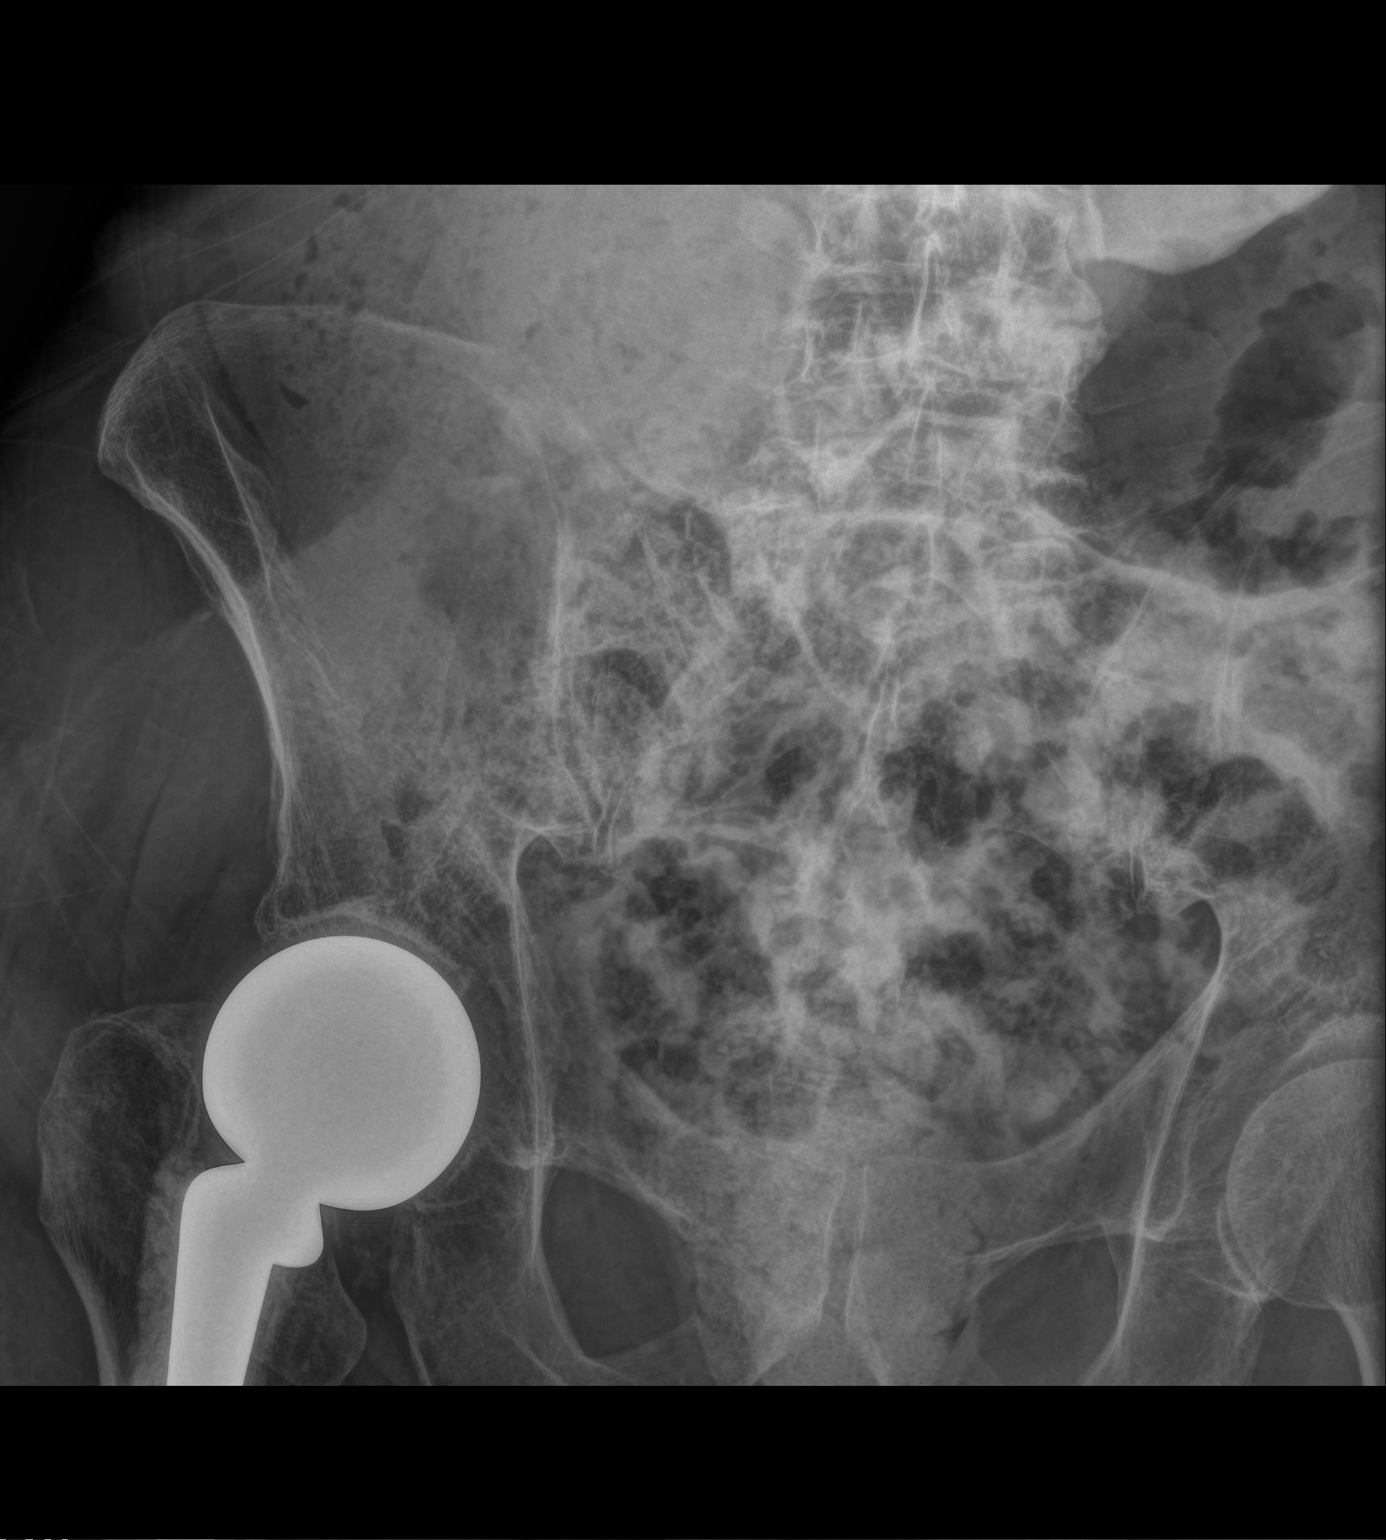

[2 of 2 positions shown; findings below may reference images not displayed]

FINDINGS: Moderate increased stool noted in the colon. There is no bowel
dilation to suggest obstruction. No free air.

Bony structures are demineralized. Right hip prosthesis is well
aligned.
IMPRESSION: 1. No acute findings.
2. Moderate increased stool in the colon.
